# Patient Record
Sex: Male | Born: 1975 | Race: Black or African American | Marital: Married | Smoking: Never smoker
Health system: Southern US, Community
[De-identification: ages and names within clinical notes are randomized; demographics above are authoritative.]

## PROBLEM LIST (undated history)

## (undated) HISTORY — PX: OTHER SURGICAL HISTORY: SHX169

---

## 2012-06-22 ENCOUNTER — Ambulatory Visit (INDEPENDENT_AMBULATORY_CARE_PROVIDER_SITE_OTHER): Payer: Medicaid Other | Admitting: Family Medicine

## 2012-06-22 ENCOUNTER — Encounter: Payer: Self-pay | Admitting: Family Medicine

## 2012-06-22 VITALS — BP 114/76 | HR 51 | Ht 70.0 in | Wt 146.0 lb

## 2012-06-22 DIAGNOSIS — M25519 Pain in unspecified shoulder: Secondary | ICD-10-CM

## 2012-06-24 ENCOUNTER — Encounter: Payer: Self-pay | Admitting: Family Medicine

## 2012-06-24 DIAGNOSIS — M25519 Pain in unspecified shoulder: Secondary | ICD-10-CM | POA: Insufficient documentation

## 2012-06-24 NOTE — Progress Notes (Signed)
  Subjective:    Patient ID: Jay Wright, male    DOB: [birth year], 36 y.o.   MRN: [id number]  HPI  Right shoulder pain for greater than 6 years. At some point he has had one or 2 corticosteroid injections which helped for several months. Pain is been getting worse over the last 2-3 months. He is in the [place] for 2 months. He is worried that we finally gets a job that this might impede his ability to do things because his pain increases with carrying heavy things. Pain is at the anterior part of the shoulder and radiates to the scapula on the right. He recounts several episodes of what sounds like subluxation. He's never had surgery on the shoulder. He is right-hand dominant.  PERTINENT  PMH / PSH:  He is a refugee from [place] living in [place] for several years and then recently immigrated to the [place].  Review of Systems    denies numbness or tingling in the arm or hand. Denies recent weight change. Objective:   Physical Exam  GENERAL: Well-developed thin young man no acute distress SHOULDER: Bilaterally he has very mobile shoulders. He has positive apprehension sign on the right. He has some pain with supraspinatus testing and also pain with liftoff sign. His strength in all planes of the rotator cuff is intact and symmetrical with that of the left side. Rhomboid muscle in the back is tender to palpation and this is the area where he has his scapular pain. The scapula move symmetrically.  INJECTION: Patient was given informed consent, signed copy in the chart. Appropriate time out was taken. Area prepped and draped in usual sterile fashion. One cc of methylprednisolone 40 mg/ml plus  9 cc of 1% lidocaine without epinephrine was injected into the 2 cc went into the subacromial bursa and 8 cc were into the glenohumeral joint using a(n) posterior approach. The patient tolerated the procedure well. There were no complications. Post procedure instructions were given.         Assessment & Plan:  #1. Shoulder pain which I think is related to probable multiple subluxations. 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 I'll start him on shoulder strengthening program that is rotator cuff program for both sides. As he is having some pain now we gave him corticosteroid injection today. I'll also start him on pushups for his rhomboid muscle rehabilitation. He will followup in 4-6 weeks.

## 2012-08-14 ENCOUNTER — Ambulatory Visit (INDEPENDENT_AMBULATORY_CARE_PROVIDER_SITE_OTHER): Payer: Medicaid Other | Admitting: Family Medicine

## 2012-08-14 ENCOUNTER — Encounter: Payer: Self-pay | Admitting: Family Medicine

## 2012-08-14 VITALS — BP 115/79 | Ht 73.62 in | Wt 143.3 lb

## 2012-08-14 DIAGNOSIS — M25519 Pain in unspecified shoulder: Secondary | ICD-10-CM

## 2012-08-14 NOTE — Progress Notes (Signed)
[facility] Attending Note: I have seen and examined this patient. I have discussed this patient with the resident and reviewed the assessment and plan as documented above. I agree with the resident's findings and plan. He continues to have pain with lifting activities which limits his ability to do meaningful work. It's unclear to me whether or not surgical intervention would help him that'll be happy to set that up.

## 2012-08-14 NOTE — Assessment & Plan Note (Signed)
Pain improved following injection. However patient continues to note instability.  Plan for MRI arthrogram with followup with surgery as needed.  Discussed warning signs or symptoms. Please see discharge instructions. Patient expresses understanding.

## 2012-08-14 NOTE — Patient Instructions (Addendum)
Thank you for coming in today. We will call you with the results  You have been scheduled for a MR Arthrogram 08/26/12 please arrive at 2:45pm at [address] [facility]  Their phone number is [phone]

## 2012-08-14 NOTE — Progress Notes (Signed)
Jay Wright is a 36 y.o. male who presents to [facility] today for followup right shoulder pain. Patient has had mild to moderate lateral and posterior shoulder pain for several years. Additionally he notes instability. He has a long history of possible shoulder dislocations or subluxations. He is from [place] and has been in a refugee living in [place] until recently.  He used to play soccer as a young man.  He denies any neck pain weakness or numbness.    Pain improved following injection however continued instability and mild subluxation episodes.   PMH reviewed.  History  Substance Use Topics  . Smoking status: Never Smoker   . Smokeless tobacco: Never Used  . Alcohol Use: Not on file   ROS as above otherwise neg   Exam:  BP 115/79  Ht 6' 1.62" (1.87 m)  Wt 143 lb 4.8 oz (65 kg)  BMI 18.59 kg/m2 Gen: Well NAD MSK: Right shoulder:  Normal in appearance, good muscle bulk no atrophy.  Range of motion intact abduction, forward flexion, internal and external rotation Strength intact 5/5 in all modalities Negative impingement testing, including Hawkins, Neer's, empty can.  Mildly positive anterior apprehension test.

## 2012-08-17 ENCOUNTER — Ambulatory Visit: Payer: Self-pay | Admitting: Family Medicine

## 2012-08-26 ENCOUNTER — Ambulatory Visit
Admission: RE | Admit: 2012-08-26 | Discharge: 2012-08-26 | Disposition: A | Discharge status: Home / Self Care | Payer: Medicaid Other | Source: Ambulatory Visit | Attending: Family Medicine | Admitting: Family Medicine

## 2012-08-26 DIAGNOSIS — M25519 Pain in unspecified shoulder: Secondary | ICD-10-CM

## 2012-08-26 MED ORDER — IOHEXOL 180 MG/ML  SOLN
15.0000 mL | Freq: Once | INTRAMUSCULAR | Status: AC | PRN
  Administered 2012-08-26: 15 mL via INTRA_ARTICULAR

## 2012-08-27 ENCOUNTER — Telehealth: Payer: Self-pay | Admitting: Family Medicine

## 2012-08-27 NOTE — Telephone Encounter (Signed)
Amy plz call him and tell him there IS  A small tear in the lining of his shoulder joint--I would rec he see an orthopedist---I would set him up with Graves or Dahldorf Or Josiah Lobo! Denny Levy

## 2012-08-27 NOTE — Telephone Encounter (Signed)
Left VM for patient to return my call

## 2012-08-31 NOTE — Telephone Encounter (Signed)
Left pt. VM to return my call.

## 2012-08-31 NOTE — Telephone Encounter (Signed)
Scheduled pt with Dr. Luiz Blare for 09/08/12, but pt asked that I cancel the appt and he would call back when he wants it rescheduled.  NPI number for pt's PCP is [id number]- [facility] be needed for Medicaid referral.

## 2012-09-04 ENCOUNTER — Encounter: Payer: Self-pay | Admitting: Family Medicine

## 2012-09-04 ENCOUNTER — Ambulatory Visit (INDEPENDENT_AMBULATORY_CARE_PROVIDER_SITE_OTHER): Payer: Medicaid Other | Admitting: Family Medicine

## 2012-09-04 VITALS — BP 112/79 | HR 66 | Ht 70.08 in | Wt 144.0 lb

## 2012-09-04 DIAGNOSIS — M25519 Pain in unspecified shoulder: Secondary | ICD-10-CM

## 2012-09-04 NOTE — Patient Instructions (Addendum)
You have been scheduled for an appointment at [facility] 09/11/12 at 10:15am with Dr. Luiz Blare.    Their address is [address]                              [place], [address]  Phone number is [phone]

## 2012-09-04 NOTE — Progress Notes (Signed)
Patient ID: Jay Wright, male   DOB: [birth year], 36 y.o.   MRN: [id number] Here for questions relating to his right shoulder pain. We had contacted him after the MR arthrogram. He has a slap lesion in the right shoulder. He is questioning whether not an arthroscopic procedure or other type surgery would allow him to do heavy work. I told him that I don't think he can become a weightlifter but I imagine he can do most work after an arthroscopic or other type of shoulder procedure.  I think he is concerned about making a living. He is got a five-year plan in place in is very diligent in finding work and taking care of his family. The shoulder pain is worse with lifting of objects 25 pounds or more.  Assessment: Slap lesion. He is fairly mobile shoulder joints as well. I have referred him to orthopedics for further evaluation and treatment. He can followup with [place] when necessary.

## 2012-10-29 ENCOUNTER — Ambulatory Visit: Payer: Medicaid Other | Attending: Orthopedic Surgery | Admitting: Physical Therapy

## 2012-10-29 DIAGNOSIS — IMO0001 Reserved for inherently not codable concepts without codable children: Secondary | ICD-10-CM | POA: Insufficient documentation

## 2012-10-29 DIAGNOSIS — M25619 Stiffness of unspecified shoulder, not elsewhere classified: Secondary | ICD-10-CM | POA: Insufficient documentation

## 2012-10-29 DIAGNOSIS — M25519 Pain in unspecified shoulder: Secondary | ICD-10-CM | POA: Insufficient documentation

## 2012-11-09 ENCOUNTER — Ambulatory Visit: Payer: Medicaid Other | Admitting: Rehabilitation

## 2012-11-16 ENCOUNTER — Ambulatory Visit: Payer: Medicaid Other | Admitting: Rehabilitation

## 2012-11-23 ENCOUNTER — Ambulatory Visit: Payer: Medicaid Other | Attending: Orthopedic Surgery | Admitting: Rehabilitation

## 2012-11-23 DIAGNOSIS — M25519 Pain in unspecified shoulder: Secondary | ICD-10-CM | POA: Insufficient documentation

## 2012-11-23 DIAGNOSIS — M25619 Stiffness of unspecified shoulder, not elsewhere classified: Secondary | ICD-10-CM | POA: Insufficient documentation

## 2012-11-23 DIAGNOSIS — IMO0001 Reserved for inherently not codable concepts without codable children: Secondary | ICD-10-CM | POA: Insufficient documentation

## 2012-12-01 ENCOUNTER — Encounter: Payer: Medicaid Other | Admitting: Rehabilitation

## 2012-12-08 ENCOUNTER — Encounter: Payer: Medicaid Other | Admitting: Rehabilitation

## 2012-12-30 ENCOUNTER — Ambulatory Visit: Payer: Medicaid Other | Attending: Orthopedic Surgery | Admitting: Physical Therapy

## 2012-12-30 DIAGNOSIS — M25619 Stiffness of unspecified shoulder, not elsewhere classified: Secondary | ICD-10-CM | POA: Insufficient documentation

## 2012-12-30 DIAGNOSIS — IMO0001 Reserved for inherently not codable concepts without codable children: Secondary | ICD-10-CM | POA: Insufficient documentation

## 2012-12-30 DIAGNOSIS — M25519 Pain in unspecified shoulder: Secondary | ICD-10-CM | POA: Insufficient documentation

## 2013-01-04 ENCOUNTER — Ambulatory Visit: Payer: Medicaid Other | Admitting: Physical Therapy

## 2013-01-05 ENCOUNTER — Ambulatory Visit: Payer: Medicaid Other | Admitting: Physical Therapy

## 2013-01-11 ENCOUNTER — Ambulatory Visit: Payer: Medicaid Other | Admitting: Physical Therapy

## 2013-01-14 ENCOUNTER — Ambulatory Visit: Payer: Medicaid Other | Admitting: Physical Therapy

## 2013-01-19 ENCOUNTER — Ambulatory Visit: Payer: Medicaid Other | Admitting: Rehabilitation

## 2013-01-20 ENCOUNTER — Ambulatory Visit: Payer: Medicaid Other | Admitting: Rehabilitation

## 2013-01-26 ENCOUNTER — Ambulatory Visit: Payer: Medicaid Other | Attending: Orthopedic Surgery | Admitting: Rehabilitation

## 2013-01-26 DIAGNOSIS — M25619 Stiffness of unspecified shoulder, not elsewhere classified: Secondary | ICD-10-CM | POA: Insufficient documentation

## 2013-01-26 DIAGNOSIS — IMO0001 Reserved for inherently not codable concepts without codable children: Secondary | ICD-10-CM | POA: Insufficient documentation

## 2013-01-26 DIAGNOSIS — M25519 Pain in unspecified shoulder: Secondary | ICD-10-CM | POA: Insufficient documentation

## 2013-01-28 ENCOUNTER — Ambulatory Visit: Payer: Medicaid Other | Admitting: Physical Therapy

## 2013-02-02 ENCOUNTER — Ambulatory Visit: Payer: Medicaid Other | Admitting: Rehabilitation

## 2013-02-04 ENCOUNTER — Ambulatory Visit: Payer: Medicaid Other | Admitting: Physical Therapy

## 2013-02-04 ENCOUNTER — Encounter: Payer: Medicaid Other | Admitting: Rehabilitation

## 2013-02-09 ENCOUNTER — Ambulatory Visit: Payer: Medicaid Other | Admitting: Rehabilitation

## 2013-02-11 ENCOUNTER — Ambulatory Visit: Payer: Medicaid Other | Admitting: Rehabilitation

## 2013-02-16 ENCOUNTER — Ambulatory Visit: Payer: Medicaid Other | Admitting: Physical Therapy

## 2013-02-16 ENCOUNTER — Encounter: Payer: No Typology Code available for payment source | Admitting: Physical Therapy

## 2013-02-18 ENCOUNTER — Encounter: Payer: No Typology Code available for payment source | Admitting: Physical Therapy

## 2013-02-23 ENCOUNTER — Encounter: Payer: No Typology Code available for payment source | Admitting: Physical Therapy

## 2013-02-25 ENCOUNTER — Encounter: Payer: No Typology Code available for payment source | Admitting: Physical Therapy

## 2014-08-23 ENCOUNTER — Ambulatory Visit: Payer: Medicaid Other | Attending: Orthopedic Surgery | Admitting: Physical Therapy

## 2014-08-23 DIAGNOSIS — IMO0001 Reserved for inherently not codable concepts without codable children: Secondary | ICD-10-CM | POA: Diagnosis not present

## 2014-08-23 DIAGNOSIS — M25569 Pain in unspecified knee: Secondary | ICD-10-CM | POA: Insufficient documentation

## 2016-11-13 ENCOUNTER — Other Ambulatory Visit: Payer: Self-pay | Admitting: Family Medicine

## 2016-11-13 ENCOUNTER — Ambulatory Visit
Admission: RE | Admit: 2016-11-13 | Discharge: 2016-11-13 | Disposition: A | Discharge status: Home / Self Care | Payer: No Typology Code available for payment source | Source: Ambulatory Visit | Attending: Family Medicine | Admitting: Family Medicine

## 2016-11-13 DIAGNOSIS — M25561 Pain in right knee: Secondary | ICD-10-CM

## 2017-09-29 ENCOUNTER — Other Ambulatory Visit: Payer: Self-pay | Admitting: Internal Medicine

## 2017-09-29 ENCOUNTER — Ambulatory Visit
Admission: RE | Admit: 2017-09-29 | Discharge: 2017-09-29 | Disposition: A | Discharge status: Home / Self Care | Payer: No Typology Code available for payment source | Source: Ambulatory Visit | Attending: Internal Medicine | Admitting: Internal Medicine

## 2017-09-29 DIAGNOSIS — M79642 Pain in left hand: Secondary | ICD-10-CM

## 2018-06-01 ENCOUNTER — Encounter: Payer: Self-pay | Admitting: Nurse Practitioner

## 2018-06-01 ENCOUNTER — Ambulatory Visit: Payer: Self-pay | Attending: Nurse Practitioner | Admitting: Nurse Practitioner

## 2018-06-01 VITALS — BP 106/82 | HR 72 | Temp 98.6°F | Ht 68.0 in | Wt 147.0 lb

## 2018-06-01 DIAGNOSIS — M25512 Pain in left shoulder: Secondary | ICD-10-CM | POA: Insufficient documentation

## 2018-06-01 DIAGNOSIS — Z0000 Encounter for general adult medical examination without abnormal findings: Secondary | ICD-10-CM

## 2018-06-01 DIAGNOSIS — R42 Dizziness and giddiness: Secondary | ICD-10-CM | POA: Insufficient documentation

## 2018-06-01 DIAGNOSIS — M25519 Pain in unspecified shoulder: Secondary | ICD-10-CM

## 2018-06-01 DIAGNOSIS — G8929 Other chronic pain: Secondary | ICD-10-CM | POA: Insufficient documentation

## 2018-06-01 DIAGNOSIS — M791 Myalgia, unspecified site: Secondary | ICD-10-CM | POA: Insufficient documentation

## 2018-06-01 DIAGNOSIS — Z9889 Other specified postprocedural states: Secondary | ICD-10-CM | POA: Insufficient documentation

## 2018-06-01 DIAGNOSIS — M542 Cervicalgia: Secondary | ICD-10-CM | POA: Insufficient documentation

## 2018-06-01 DIAGNOSIS — M25511 Pain in right shoulder: Secondary | ICD-10-CM | POA: Insufficient documentation

## 2018-06-01 DIAGNOSIS — H409 Unspecified glaucoma: Secondary | ICD-10-CM | POA: Insufficient documentation

## 2018-06-01 MED ORDER — FLUTICASONE PROPIONATE 50 MCG/ACT NA SUSP: 2.0000 | Freq: Every day | NASAL | 6 refills | Status: DC

## 2018-06-01 MED ORDER — CYCLOBENZAPRINE HCL 5 MG PO TABS: 5.0000 mg | ORAL_TABLET | Freq: Three times a day (TID) | ORAL | 1 refills | Status: DC | PRN

## 2018-06-01 NOTE — Progress Notes (Signed)
Assessment & Plan:  Jay Wright was seen today for new patient (initial visit).  Diagnoses and all orders for this visit:  Chronic left shoulder pain -     cyclobenzaprine (FLEXERIL) 5 MG tablet; Take 1 tablet (5 mg total) by mouth 3 (three) times daily as needed for muscle spasms.   Dizziness -     fluticasone (FLONASE) 50 MCG/ACT nasal spray; Place 2 sprays into both nostrils daily.  Neck and shoulder pain -     cyclobenzaprine (FLEXERIL) 5 MG tablet; Take 1 tablet (5 mg total) by mouth 3 (three) times daily as needed for muscle spasms.  May alternate with heat and ice application for pain relief. May also alternate with acetaminophen and Ibuprofen as prescribed for back pain. Other alternatives include massage and acupuncture.    Routine adult health maintenance -     CBC -     Basic metabolic panel -     Lipid panel -     VITAMIN D 25 Hydroxy (Vit-D Deficiency, Fractures)    Patient has been counseled on age-appropriate routine health concerns for screening and prevention. These are reviewed and up-to-date. Referrals have been placed accordingly. Immunizations are up-to-date or declined.    Subjective:   Chief Complaint  Patient presents with  . New Patient (Initial Visit)    Pt. is here for dizzness, he stated he gets dizzy when he drive and walk. Pt. stated he have pain on both of his shoulder radiates to his neck.    HPI Jay Wright 42 y.o. male presents to office today to establish care. He is currently seeing an eye doctor for glaucoma in his left eye. Taking prednisolone at this time.   Shoulder Pain Patient complaints of left shoulder pain along with myalgia of his neck and bilateral trapezius area.   He denies any injury to his left shoulder. The pain is described as aching and sharp.  The onset of the pain was several years ago.  The pain occurs intermittently and can sometimes lasts several hours.  Location is global. No history of dislocation. Symptoms are aggravated by  lifting, twisting, repetitive use. Symptoms are diminished by  avoiding the painful activities.  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 He had right shoulder arthroscopic surgery several years ago and reports minimal pain in his right shoulder.    Dizziness Endorses dizziness with onset 3 weeks ago. Dizziness has improved over the past few days. Aggravating factors: Sudden movements. He does state he just finished Ramadan and may have not been drinking enough fluids during this religious time of fasting. Since Ramadan is over he has returned to his normal dietary intake and dizziness has improved.    Review of Systems  Constitutional: Negative for fever, malaise/fatigue and weight loss.  HENT: Negative.  Negative for nosebleeds.   Eyes: Positive for blurred vision and double vision. Negative for photophobia.       Glaucoma  Respiratory: Negative.  Negative for cough and shortness of breath.   Cardiovascular: Negative.  Negative for chest pain, palpitations and leg swelling.  Gastrointestinal: Negative.  Negative for heartburn, nausea and vomiting.  Genitourinary: Negative.  Negative for dysuria, flank pain, frequency, hematuria and urgency.  Musculoskeletal: Positive for joint pain, myalgias and neck pain.  Neurological: Positive for dizziness. Negative for focal weakness, seizures and headaches.  Psychiatric/Behavioral: Negative.  Negative for suicidal ideas.    History reviewed. No pertinent past medical history.  Past Surgical History:  Procedure Laterality Date  . right shoulder surgery      History reviewed. No pertinent family history.  Social History Reviewed with no changes to be made today.   Outpatient Medications Prior to Visit  Medication Sig Dispense Refill  . prednisoLONE acetate (PRED FORTE) 1 % ophthalmic suspension 1 drop 4 (four) times daily.     No facility-administered medications prior to visit.     No Known Allergies     Objective:      BP 106/82 (BP Location: Right Arm, Patient Position: Sitting, Cuff Size: Normal)   Pulse 72   Temp 98.6 F (37 C) (Oral)   Ht 5\' 8"  (1.727 m)   Wt 147 lb (66.7 kg)   SpO2 95%   BMI 22.35 kg/m  Wt Readings from Last 3 Encounters:  06/01/18 147 lb (66.7 kg)  09/04/12 144 lb (65.3 kg)  08/14/12 143 lb 4.8 oz (65 kg)    Physical Exam  Constitutional: He is oriented to person, place, and time. He appears well-developed and well-nourished. He is cooperative.  HENT:  Head: Normocephalic and atraumatic.  Right Ear: Tympanic membrane is scarred. A middle ear effusion is present.  Left Ear: A middle ear effusion is present.  Nose: Nose normal.  Eyes: EOM are normal.  Neck: Normal range of motion.  Cardiovascular: Normal rate, regular rhythm and normal heart sounds. Exam reveals no gallop and no friction rub.  No murmur heard. Pulmonary/Chest: Effort normal and breath sounds normal. No tachypnea. No respiratory distress. He has no decreased breath sounds. He has no wheezes. He has no rhonchi. He has no rales. He exhibits no tenderness.  Abdominal: Soft. Bowel sounds are normal.  Musculoskeletal: Normal range of motion. He exhibits no edema.       Left shoulder: He exhibits pain (with bilateral push pull maneuver). He exhibits normal range of motion, no tenderness, no bony tenderness and no swelling.       Arms: Neurological: He is alert and oriented to person, place, and time. He displays a negative Romberg sign. Coordination and gait normal.  Skin: Skin is warm and dry.  Psychiatric: He has a normal mood and affect. His behavior is normal. Judgment and thought content normal.  Nursing note and vitals reviewed.     Patient has been counseled extensively about nutrition and exercise as well as the importance of adherence with medications and regular follow-up. The patient was given clear instructions to go to ER or return to medical center if symptoms don't improve, worsen or new  problems develop. The patient verbalized understanding.   Follow-up: Return in about 8 weeks (around 07/27/2018).   Claiborne Rigg, FNP-BC [facility] [place], [place] [phone]   06/01/2018, 2:39 PM

## 2018-06-02 ENCOUNTER — Other Ambulatory Visit: Payer: Self-pay | Admitting: Nurse Practitioner

## 2018-06-02 LAB — CBC
HEMOGLOBIN: 14.9 g/dL (ref 13.0–17.7)
Hematocrit: 42.9 % (ref 37.5–51.0)
MCH: 28.2 pg (ref 26.6–33.0)
MCHC: 34.7 g/dL (ref 31.5–35.7)
MCV: 81 fL (ref 79–97)
Platelets: 207 10*3/uL (ref 150–450)
RBC: 5.28 x10E6/uL (ref 4.14–5.80)
RDW: 13.9 % (ref 12.3–15.4)
WBC: 3.2 10*3/uL — ABNORMAL LOW (ref 3.4–10.8)

## 2018-06-02 LAB — BASIC METABOLIC PANEL
BUN / CREAT RATIO: 13 (ref 9–20)
BUN: 13 mg/dL (ref 6–20)
CO2: 26 mmol/L (ref 20–29)
CREATININE: 0.97 mg/dL (ref 0.76–1.27)
Calcium: 9.4 mg/dL (ref 8.7–10.2)
Chloride: 103 mmol/L (ref 96–106)
GFR, EST AFRICAN AMERICAN: 113 mL/min/{1.73_m2} (ref >59)
GFR, EST NON AFRICAN AMERICAN: 98 mL/min/{1.73_m2} (ref >59)
Glucose: 85 mg/dL (ref 65–99)
Potassium: 4.3 mmol/L (ref 3.5–5.2)
Sodium: 141 mmol/L (ref 134–144)

## 2018-06-02 LAB — VITAMIN D 25 HYDROXY (VIT D DEFICIENCY, FRACTURES): VIT D 25 HYDROXY: 13 ng/mL — AB (ref 30.0–100.0)

## 2018-06-02 LAB — LIPID PANEL
Chol/HDL Ratio: 2.6 ratio (ref 0.0–5.0)
Cholesterol, Total: 132 mg/dL (ref 100–199)
HDL: 50 mg/dL (ref >39)
LDL CALC: 67 mg/dL (ref 0–99)
Triglycerides: 74 mg/dL (ref 0–149)
VLDL CHOLESTEROL CAL: 15 mg/dL (ref 5–40)

## 2018-06-02 MED ORDER — VITAMIN D (ERGOCALCIFEROL) 1.25 MG (50000 UNIT) PO CAPS: 50000.0000 [IU] | ORAL_CAPSULE | ORAL | 1 refills | Status: DC

## 2018-06-04 ENCOUNTER — Telehealth: Payer: Self-pay

## 2018-06-04 NOTE — Telephone Encounter (Signed)
CMA attempt to call patient to inform on lab results. No answer and left a VM for patient to call back.  If patient call back, please inform:  Labs are essentially normal however vitamin d is low. Will send in a prescription for you to take vitamin d weekly for the next 12 weeks. Make sure you are drinking at least 48 oz of water per day. Work on eating a low fat, heart healthy diet and participate in regular aerobic exercise program to control as well. Exercise at least 150 minutes per week.

## 2018-06-04 NOTE — Telephone Encounter (Signed)
-----   Message from Claiborne RiggZelda W Fleming, NP sent at 06/02/2018  5:03 PM EDT ----- Labs are essentially normal however vitamin d is low. Will send in a prescription for you to take vitamin d weekly for the next 12 weeks. Make sure you are drinking at least 48 oz of water per day. Work on eating a low fat, heart healthy diet and participate in regular aerobic exercise program to control as well. Exercise at least 150 minutes per week.

## 2018-06-08 ENCOUNTER — Ambulatory Visit: Payer: Medicaid Other | Attending: Nurse Practitioner

## 2018-06-11 ENCOUNTER — Telehealth: Payer: Self-pay | Admitting: Nurse Practitioner

## 2018-06-11 NOTE — Telephone Encounter (Signed)
Pt called requesting his OC, if pt called please inform him it may take up to 2 weekd for it to be ready

## 2018-06-19 ENCOUNTER — Encounter (HOSPITAL_COMMUNITY): Payer: Self-pay | Admitting: *Deleted

## 2018-06-19 ENCOUNTER — Other Ambulatory Visit: Payer: Self-pay

## 2018-06-19 ENCOUNTER — Ambulatory Visit (HOSPITAL_COMMUNITY)
Admission: EM | Admit: 2018-06-19 | Discharge: 2018-06-19 | Disposition: A | Discharge status: Home / Self Care | Payer: Medicaid Other | Attending: Family Medicine | Admitting: Family Medicine

## 2018-06-19 DIAGNOSIS — X500XXA Overexertion from strenuous movement or load, initial encounter: Secondary | ICD-10-CM

## 2018-06-19 DIAGNOSIS — S39012A Strain of muscle, fascia and tendon of lower back, initial encounter: Secondary | ICD-10-CM

## 2018-06-19 MED ORDER — DICLOFENAC SODIUM 75 MG PO TBEC: 75.0000 mg | DELAYED_RELEASE_TABLET | Freq: Two times a day (BID) | ORAL | 0 refills | Status: DC

## 2018-06-19 MED ORDER — KETOROLAC TROMETHAMINE 60 MG/2ML IM SOLN
60.0000 mg | Freq: Once | INTRAMUSCULAR | Status: AC
  Administered 2018-06-19: 60 mg via INTRAMUSCULAR

## 2018-06-19 MED ORDER — KETOROLAC TROMETHAMINE 60 MG/2ML IM SOLN
INTRAMUSCULAR | Status: AC
  Filled 2018-06-19: qty 2

## 2018-06-19 MED ORDER — PREDNISONE 50 MG PO TABS: 50.0000 mg | ORAL_TABLET | Freq: Every day | ORAL | 0 refills | Status: AC

## 2018-06-19 MED ORDER — METHOCARBAMOL 750 MG PO TABS: 750.0000 mg | ORAL_TABLET | Freq: Four times a day (QID) | ORAL | 0 refills | Status: AC

## 2018-06-19 NOTE — ED Triage Notes (Signed)
C/o lower back pain onset last Sunday

## 2018-06-19 NOTE — ED Provider Notes (Signed)
[facility]    CSN: [id number] Arrival date & time: 06/19/18  [id number]     History   Chief Complaint Chief Complaint  Patient presents with  . Back Pain    HPI Jay Wright is a 42 y.o. male no significant past medical history presenting today for evaluation of back pain.  Patient states that last Sunday, approximately 6 days ago he was carrying a heavy load of close, he all of a sudden felt a pull in his left lower back.  Since he has had significant pain and worsening pain with bending and twisting motions.  Denies any radiation down his leg.  Denies numbness or tingling.  Denies saddle anesthesia or loss of bowel or bladder control.  He has taken Flexeril which he uses for his shoulder, but this is not been helping him.  HPI  History reviewed. No pertinent past medical history.  Patient Active Problem List   Diagnosis Date Noted  . Shoulder pain 06/24/2012    Past Surgical History:  Procedure Laterality Date  . right shoulder surgery         Home Medications    Prior to Admission medications   Medication Sig Start Date End Date Taking? Authorizing Provider  cyclobenzaprine (FLEXERIL) 5 MG tablet Take 1 tablet (5 mg total) by mouth 3 (three) times daily as needed for muscle spasms. 06/01/18  Yes Claiborne RiggFleming, Zelda W, NP  fluticasone (FLONASE) 50 MCG/ACT nasal spray Place 2 sprays into both nostrils daily. 06/01/18  Yes Claiborne RiggFleming, Zelda W, NP  diclofenac (VOLTAREN) 75 MG EC tablet Take 1 tablet (75 mg total) by mouth 2 (two) times daily. 06/19/18   Madden Garron C, PA-C  methocarbamol (ROBAXIN-750) 750 MG tablet Take 1 tablet (750 mg total) by mouth 4 (four) times daily for 10 days. 06/19/18 06/29/18  Shakiyla Kook C, PA-C  predniSONE (DELTASONE) 50 MG tablet Take 1 tablet (50 mg total) by mouth daily for 5 days. 06/19/18 06/24/18  Falon Flinchum, Junius CreamerHallie C, PA-C    Family History Family History  Problem Relation Age of Onset  . Healthy Mother   . Healthy Father     Social  History Social History   Tobacco Use  . Smoking status: Never Smoker  . Smokeless tobacco: Never Used  Substance Use Topics  . Alcohol use: Never    Frequency: Never  . Drug use: Never     Allergies   Patient has no known allergies.   Review of Systems Review of Systems  Constitutional: Negative for activity change, chills, diaphoresis and fatigue.  Eyes: Negative for photophobia and visual disturbance.  Respiratory: Negative for cough, chest tightness and shortness of breath.   Cardiovascular: Negative for chest pain and leg swelling.  Gastrointestinal: Negative for abdominal pain, blood in stool, nausea and vomiting.  Musculoskeletal: Positive for back pain and myalgias. Negative for arthralgias, gait problem, neck pain and neck stiffness.  Skin: Negative for color change and wound.  Neurological: Negative for dizziness, weakness, light-headedness, numbness and headaches.     Physical Exam Triage Vital Signs ED Triage Vitals  Enc Vitals Group     BP 06/19/18 1020 121/71     Pulse Rate 06/19/18 1020 86     Resp 06/19/18 1020 18     Temp 06/19/18 1020 98.3 F (36.8 C)     Temp Source 06/19/18 1020 Oral     SpO2 06/19/18 1020 98 %     Weight --      Height --  Head Circumference --      Peak Flow --      Pain Score 06/19/18 1021 10     Pain Loc --      Pain Edu? --      Excl. in GC? --    No data found.  Updated Vital Signs BP 121/71 (BP Location: Right Arm)   Pulse 86   Temp 98.3 F (36.8 C) (Oral)   Resp 18   SpO2 98%   Visual Acuity Right Eye Distance:   Left Eye Distance:   Bilateral Distance:    Right Eye Near:   Left Eye Near:    Bilateral Near:     Physical Exam  Constitutional: He appears well-developed and well-nourished.  HENT:  Head: Normocephalic and atraumatic.  Eyes: Conjunctivae are normal.  Neck: Neck supple.  Cardiovascular: Normal rate and regular rhythm.  No murmur heard. Pulmonary/Chest: Effort normal and breath  sounds normal. No respiratory distress.  Abdominal: Soft. There is no tenderness.  Musculoskeletal: He exhibits no edema.  Nontender to palpation of entire cervical, thoracic and lumbar spine, tenderness to palpation over left lumbar paraspinal musculature as well as more lateral left musculature.  Negative straight leg raise, although does worsen pain in left lumbar region. Able to ambulate from chair to exam table without abnormality, patient is slow to lie down and sit back up from supine position.  Neurological: He is alert.  Skin: Skin is warm and dry.  Psychiatric: He has a normal mood and affect.  Nursing note and vitals reviewed.    UC Treatments / Results  Labs (all labs ordered are listed, but only abnormal results are displayed) Labs Reviewed - No data to display  EKG None  Radiology No results found.  Procedures Procedures (including critical care time)  Medications Ordered in UC Medications  ketorolac (TORADOL) injection 60 mg (60 mg Intramuscular Given 06/19/18 1043)    Initial Impression / Assessment and Plan / UC Course  I have reviewed the triage vital signs and the nursing notes.  Pertinent labs & imaging results that were available during my care of the patient were reviewed by me and considered in my medical decision making (see chart for details).     Patient likely with lumbar strain, will recommend anti-inflammatories as well as muscle relaxer.  Will put on short course of prednisone for 5 days.  Shot of Toradol provided in clinic today.  No red flags or signs of cauda equina.  Also discussed no heavy lifting, performing back strengthening exercises once pain improving, discussed proper lifting mechanics with legs versus back.Discussed strict return precautions. Patient verbalized understanding and is agreeable with plan.  Final Clinical Impressions(s) / UC Diagnoses   Final diagnoses:  Strain of lumbar region, initial encounter     Discharge  Instructions     Please take diclofenac twice daily, please take with food Please take prednisone daily for the next 5 days, please take with food Please use Robaxin, this is a muscle relaxer, please do not use in combination with Flexeril, please use either Robaxin or Flexeril.   Please also alternate applying ice and heat throughout the day.  As will help with inflammation, he will help loosen up the muscle.  Please do not stay in bed all day, please move around and go about normal activities, but do not do any heavy lifting or excessive exercise that will worsen this.  Please return in 1 to 2 weeks if symptoms not improving, symptoms worsening,  developing loss of bowel or bladder control, numbness or tingling.   ED Prescriptions    Medication Sig Dispense Auth. Provider   diclofenac (VOLTAREN) 75 MG EC tablet Take 1 tablet (75 mg total) by mouth 2 (two) times daily. 30 tablet Rosmary Dionisio C, PA-C   predniSONE (DELTASONE) 50 MG tablet Take 1 tablet (50 mg total) by mouth daily for 5 days. 5 tablet Valena Ivanov C, PA-C   methocarbamol (ROBAXIN-750) 750 MG tablet Take 1 tablet (750 mg total) by mouth 4 (four) times daily for 10 days. 40 tablet Eloni Darius, Hachita C, PA-C     Controlled Substance Prescriptions [place] Controlled Substance Registry consulted? Not Applicable   Lew Dawes, [place] 06/19/18 1100

## 2018-06-19 NOTE — Discharge Instructions (Signed)
Please take diclofenac twice daily, please take with food Please take prednisone daily for the next 5 days, please take with food Please use Robaxin, this is a muscle relaxer, please do not use in combination with Flexeril, please use either Robaxin or Flexeril.   Please also alternate applying ice and heat throughout the day.  As will help with inflammation, he will help loosen up the muscle.  Please do not stay in bed all day, please move around and go about normal activities, but do not do any heavy lifting or excessive exercise that will worsen this.  Please return in 1 to 2 weeks if symptoms not improving, symptoms worsening, developing loss of bowel or bladder control, numbness or tingling.

## 2018-06-26 ENCOUNTER — Ambulatory Visit: Payer: Medicaid Other | Admitting: Nurse Practitioner

## 2018-07-31 ENCOUNTER — Ambulatory Visit: Payer: Self-pay | Attending: Nurse Practitioner | Admitting: Nurse Practitioner

## 2018-07-31 ENCOUNTER — Encounter: Payer: Self-pay | Admitting: Nurse Practitioner

## 2018-07-31 VITALS — BP 109/69 | HR 70 | Temp 98.3°F | Ht 68.0 in | Wt 152.8 lb

## 2018-07-31 DIAGNOSIS — R42 Dizziness and giddiness: Secondary | ICD-10-CM | POA: Insufficient documentation

## 2018-07-31 DIAGNOSIS — S6991XD Unspecified injury of right wrist, hand and finger(s), subsequent encounter: Secondary | ICD-10-CM | POA: Insufficient documentation

## 2018-07-31 DIAGNOSIS — Z79899 Other long term (current) drug therapy: Secondary | ICD-10-CM | POA: Insufficient documentation

## 2018-07-31 DIAGNOSIS — S6991XA Unspecified injury of right wrist, hand and finger(s), initial encounter: Secondary | ICD-10-CM

## 2018-07-31 DIAGNOSIS — M25519 Pain in unspecified shoulder: Secondary | ICD-10-CM | POA: Insufficient documentation

## 2018-07-31 DIAGNOSIS — W228XXD Striking against or struck by other objects, subsequent encounter: Secondary | ICD-10-CM | POA: Insufficient documentation

## 2018-07-31 NOTE — Progress Notes (Signed)
Assessment & Plan:  Jay Wright was seen today for follow-up.  Diagnoses and all orders for this visit:  Hand injury, right, subsequent encounter -     DG Hand Complete Right; Future If no improvement of symptoms will refer to hand surgery.  Patient has been counseled on age-appropriate routine health concerns for screening and prevention. These are reviewed and up-to-date. Referrals have been placed accordingly. Immunizations are up-to-date or declined.    Subjective:   Chief Complaint  Patient presents with  . Follow-up    Pt. is here for follow-up on shoulder pain and dizziness. Pt. stated he don't feel dizzy anymore and his shoulder pain is much better. Pt. had a incident at work with boxes fell on his right hand and he cannot feel any pinching on his right pinky.    HPI Jay Wright 42 y.o. male presents to office today with complaints of Right hand pain and numbness in his fingers. 3 days ago he was at work and notes he was pulling a box off of a shelf which had another box on top of it. When he attempted to pull the bottom box out the top box fell onto his right hand. He had immediate pain however the numbness and tingling onset did not occur until 24 hours after the injury. He has tried nothing for the pain or numbness.   Review of Systems  Constitutional: Negative for fever, malaise/fatigue and weight loss.  HENT: Negative for nosebleeds.   Eyes: Negative.  Negative for blurred vision, double vision and photophobia.  Respiratory: Negative.  Negative for cough and shortness of breath.   Cardiovascular: Negative.  Negative for chest pain, palpitations and leg swelling.  Gastrointestinal: Negative.  Negative for heartburn, nausea and vomiting.  Musculoskeletal: Negative for myalgias.       SEE HPI  Neurological: Negative.  Negative for dizziness, focal weakness, seizures and headaches.  Psychiatric/Behavioral: Negative.  Negative for suicidal ideas.    History reviewed. No pertinent  past medical history.  Past Surgical History:  Procedure Laterality Date  . right shoulder surgery      Family History  Problem Relation Age of Onset  . Healthy Mother   . Healthy Father     Social History Reviewed with no changes to be made today.   Outpatient Medications Prior to Visit  Medication Sig Dispense Refill  . cyclobenzaprine (FLEXERIL) 5 MG tablet Take 1 tablet (5 mg total) by mouth 3 (three) times daily as needed for muscle spasms. 30 tablet 1  . diclofenac (VOLTAREN) 75 MG EC tablet Take 1 tablet (75 mg total) by mouth 2 (two) times daily. 30 tablet 0  . fluticasone (FLONASE) 50 MCG/ACT nasal spray Place 2 sprays into both nostrils daily. 16 g 6   No facility-administered medications prior to visit.     No Known Allergies     Objective:    BP 109/69 (BP Location: Left Arm, Patient Position: Sitting, Cuff Size: Normal)   Pulse 70   Temp 98.3 F (36.8 C) (Oral)   Ht 5\' 8"  (1.727 m)   Wt 152 lb 12.8 oz (69.3 kg)   SpO2 95%   BMI 23.23 kg/m  Wt Readings from Last 3 Encounters:  07/31/18 152 lb 12.8 oz (69.3 kg)  06/01/18 147 lb (66.7 kg)  09/04/12 144 lb (65.3 kg)    Physical Exam  Constitutional: He is oriented to person, place, and time. He appears well-developed and well-nourished. He is cooperative.  HENT:  Head: Normocephalic  and atraumatic.  Cardiovascular: Normal rate, regular rhythm and normal heart sounds. Exam reveals no gallop and no friction rub.  No murmur heard. Pulmonary/Chest: Effort normal and breath sounds normal. No tachypnea. No respiratory distress. He has no decreased breath sounds. He has no wheezes. He has no rhonchi. He has no rales. He exhibits no tenderness.  Abdominal: Bowel sounds are normal.  Musculoskeletal: Normal range of motion. He exhibits tenderness. He exhibits no edema or deformity.       Right hand: He exhibits tenderness and bony tenderness. He exhibits normal range of motion, normal capillary refill, no  deformity, no laceration and no swelling. Normal strength noted. He exhibits no thumb/finger opposition.       Hands: He is able to make a fist with his right hand without eliciting pain. There are no obvious bony abnormalities. Grip Strength 5/5  Neurological: He is alert and oriented to person, place, and time. Coordination normal.  Skin: Skin is warm and dry.  Psychiatric: He has a normal mood and affect. His behavior is normal. Judgment and thought content normal.  Nursing note and vitals reviewed.        Patient has been counseled extensively about nutrition and exercise as well as the importance of adherence with medications and regular follow-up. The patient was given clear instructions to go to ER or return to medical center if symptoms don't improve, worsen or new problems develop. The patient verbalized understanding.   Follow-up: Return in about 2 weeks (around 08/14/2018) for right handl; DOUBLE BOOK MORNING.   Claiborne RiggZelda W Tamberlyn Midgley, FNP-BC [facility] [place], [place] [phone]   07/31/2018, 10:46 AM

## 2018-07-31 NOTE — Patient Instructions (Signed)
Hand Pain  Many things can cause hand pain. Some common causes are:  ? An injury.  ? Repeating the same movement with your hand over and over (overuse).  ? Osteoporosis.  ? Arthritis.  ? Lumps in the tendons or joints of the hand and wrist (ganglion cysts).  ? Infection.  Follow these instructions at home:  Pay attention to any changes in your symptoms. Take these actions to help with your discomfort:  ? If directed, put ice on the affected area:  ? Put ice in a plastic bag.  ? Place a towel between your skin and the bag.  ? Leave the ice on for 15?20 minutes, 3?4 times a day for 2 days.  ? Take over-the-counter and prescription medicines only as told by your health care provider.  ? Minimize stress on your hands and wrists as much as possible.  ? Take breaks from repetitive activity often.  ? Do stretches as told by your health care provider.  ? Do not do activities that make your pain worse.  Contact a health care provider if:  ? Your pain does not get better after a few days of self-care.  ? Your pain gets worse.  ? Your pain affects your ability to do your daily activities.  Get help right away if:  ? Your hand becomes warm, red, or swollen.  ? Your hand is numb or tingling.  ? Your hand is extremely swollen or deformed.  ? Your hand or fingers turn white or blue.  ? You cannot move your hand, wrist, or fingers.  This information is not intended to replace advice given to you by your health care provider. Make sure you discuss any questions you have with your health care provider.  Document Released: 01/05/2016 Document Revised: 05/16/2016 Document Reviewed: 01/04/2015  Elsevier Interactive Patient Education ? 2018 Elsevier Inc.

## 2018-08-03 ENCOUNTER — Ambulatory Visit (HOSPITAL_COMMUNITY)
Admission: RE | Admit: 2018-08-03 | Discharge: 2018-08-03 | Disposition: A | Discharge status: Home / Self Care | Payer: Self-pay | Source: Ambulatory Visit | Attending: Nurse Practitioner | Admitting: Nurse Practitioner

## 2018-08-03 DIAGNOSIS — R200 Anesthesia of skin: Secondary | ICD-10-CM | POA: Insufficient documentation

## 2018-08-03 DIAGNOSIS — S6991XD Unspecified injury of right wrist, hand and finger(s), subsequent encounter: Secondary | ICD-10-CM | POA: Insufficient documentation

## 2018-08-03 DIAGNOSIS — X58XXXD Exposure to other specified factors, subsequent encounter: Secondary | ICD-10-CM | POA: Insufficient documentation

## 2018-08-06 ENCOUNTER — Telehealth: Payer: Self-pay

## 2018-08-06 NOTE — Telephone Encounter (Signed)
CMA attempt to call patient to inform on Xray results.  No answer and left a VM for patient.  If patient call back, please inform:  Hand xray is normal

## 2018-08-06 NOTE — Telephone Encounter (Signed)
-----   Message from Claiborne RiggZelda W Fleming, NP sent at 08/04/2018 11:57 PM EDT ----- Hand xray is normal

## 2018-08-12 ENCOUNTER — Encounter: Payer: Self-pay | Admitting: Nurse Practitioner

## 2018-08-12 ENCOUNTER — Ambulatory Visit: Payer: Self-pay | Attending: Nurse Practitioner | Admitting: Nurse Practitioner

## 2018-08-12 VITALS — BP 119/70 | HR 60 | Temp 98.3°F | Ht 68.0 in | Wt 151.2 lb

## 2018-08-12 DIAGNOSIS — X58XXXA Exposure to other specified factors, initial encounter: Secondary | ICD-10-CM | POA: Insufficient documentation

## 2018-08-12 DIAGNOSIS — Z79899 Other long term (current) drug therapy: Secondary | ICD-10-CM | POA: Insufficient documentation

## 2018-08-12 DIAGNOSIS — R200 Anesthesia of skin: Secondary | ICD-10-CM | POA: Insufficient documentation

## 2018-08-12 DIAGNOSIS — R202 Paresthesia of skin: Secondary | ICD-10-CM | POA: Insufficient documentation

## 2018-08-12 DIAGNOSIS — S6991XA Unspecified injury of right wrist, hand and finger(s), initial encounter: Secondary | ICD-10-CM | POA: Insufficient documentation

## 2018-08-12 NOTE — Patient Instructions (Signed)
Paresthesia Paresthesia is a burning or prickling feeling. This feeling can happen in any part of the body. It often happens in the hands, arms, legs, or feet. Usually, it is not painful. In most cases, the feeling goes away in a short time and is not a sign of a serious problem. Follow these instructions at home:  Avoid drinking alcohol.  Try massage or needle therapy (acupuncture) to help with your problems.  Keep all follow-up visits as told by your doctor. This is important. Contact a doctor if:  You keep on having episodes of paresthesia.  Your burning or prickling feeling gets worse when you walk.  You have pain or cramps.  You feel dizzy.  You have a rash. Get help right away if:  You feel weak.  You have trouble walking or moving.  You have problems speaking, understanding, or seeing.  You feel confused.  You cannot control when you pee (urinate) or poop (bowel movement).  You lose feeling (numbness) after an injury.  You pass out (faint). This information is not intended to replace advice given to you by your health care provider. Make sure you discuss any questions you have with your health care provider. Document Released: 11/21/2008 Document Revised: 05/16/2016 Document Reviewed: 12/05/2014 Elsevier Interactive Patient Education  2018 Elsevier Inc.  

## 2018-08-12 NOTE — Progress Notes (Signed)
Assessment & Plan:  Jay Wright was seen today for follow-up.  Diagnoses and all orders for this visit:  Paresthesia -     Ambulatory referral to Hand Surgery    Patient has been counseled on age-appropriate routine health concerns for screening and prevention. These are reviewed and up-to-date. Referrals have been placed accordingly. Immunizations are up-to-date or declined.    Subjective:   Chief Complaint  Patient presents with  . Follow-up    Pt. is here to follow-up on right hand pain. Pt. stated it hurts a little bit, but more in the morning.    HPI Jay Wright 42 y.o. male presents to office today for follow up to right hand pain and paresthesia.   Right Hand injury Approximately 2-3 weeks ago he sustained a work injury to his right hand. States he was pulling a large box off of a shelf. Apparently the box was sitting under another box and when he went to pull the lower box off the shelf the box that was sitting on top fell onto his right hand. He experienced immediate pain and then the next day began to experience numbness, pain and tingling of the right 4th and 5th fingers. Today he endorses persistent paresthesia of the same area. Xray of the right hand was negative for fracture. As he continues to endorse paresthesia I will refer him to the hand specialist for nerve conduction study.   Review of Systems  Constitutional: Negative for fever, malaise/fatigue and weight loss.  HENT: Negative.  Negative for nosebleeds.   Eyes: Negative.  Negative for blurred vision, double vision and photophobia.  Respiratory: Negative.  Negative for cough and shortness of breath.   Cardiovascular: Negative.  Negative for chest pain, palpitations and leg swelling.  Gastrointestinal: Negative.  Negative for heartburn, nausea and vomiting.  Musculoskeletal: Positive for joint pain (right shoulder). Negative for myalgias.       SEE HPI  Neurological: Positive for tingling and sensory change. Negative  for dizziness, focal weakness, seizures and headaches.  Psychiatric/Behavioral: Negative.  Negative for suicidal ideas.    History reviewed. No pertinent past medical history.  Past Surgical History:  Procedure Laterality Date  . right shoulder surgery      Family History  Problem Relation Age of Onset  . Healthy Mother   . Healthy Father     Social History Reviewed with no changes to be made today.   Outpatient Medications Prior to Visit  Medication Sig Dispense Refill  . fluticasone (FLONASE) 50 MCG/ACT nasal spray Place 2 sprays into both nostrils daily. 16 g 6  . cyclobenzaprine (FLEXERIL) 5 MG tablet Take 1 tablet (5 mg total) by mouth 3 (three) times daily as needed for muscle spasms. (Patient not taking: Reported on 08/12/2018) 30 tablet 1  . diclofenac (VOLTAREN) 75 MG EC tablet Take 1 tablet (75 mg total) by mouth 2 (two) times daily. (Patient not taking: Reported on 08/12/2018) 30 tablet 0   No facility-administered medications prior to visit.     No Known Allergies     Objective:    BP 119/70 (BP Location: Left Arm, Patient Position: Sitting, Cuff Size: Normal)   Pulse 60   Temp 98.3 F (36.8 C) (Oral)   Ht 5\' 8"  (1.727 m)   Wt 151 lb 3.2 oz (68.6 kg)   SpO2 98%   BMI 22.99 kg/m  Wt Readings from Last 3 Encounters:  08/12/18 151 lb 3.2 oz (68.6 kg)  07/31/18 152 lb 12.8 oz (69.3  kg)  06/01/18 147 lb (66.7 kg)    Physical Exam  Constitutional: He is oriented to person, place, and time. He appears well-developed and well-nourished. He is cooperative.  HENT:  Head: Normocephalic and atraumatic.  Eyes: EOM are normal.  Neck: Normal range of motion.  Cardiovascular: Normal rate, regular rhythm and normal heart sounds. Exam reveals no gallop and no friction rub.  No murmur heard. Pulmonary/Chest: Effort normal and breath sounds normal. No tachypnea. No respiratory distress. He has no decreased breath sounds. He has no wheezes. He has no rhonchi. He has no  rales. He exhibits no tenderness.  Abdominal: Soft. Bowel sounds are normal.  Musculoskeletal: Normal range of motion. He exhibits no edema or deformity.       Right hand: He exhibits tenderness. He exhibits normal capillary refill, no deformity, no laceration and no swelling. Decreased sensation noted. Normal strength noted. He exhibits no thumb/finger opposition.       Hands: He endorses numbness and tingling of the right hand. Strength is normal.   Neurological: He is alert and oriented to person, place, and time. Coordination normal.  Skin: Skin is warm and dry.  Psychiatric: He has a normal mood and affect. His behavior is normal. Judgment and thought content normal.  Nursing note and vitals reviewed.        Patient has been counseled extensively about nutrition and exercise as well as the importance of adherence with medications and regular follow-up. The patient was given clear instructions to go to ER or return to medical center if symptoms don't improve, worsen or new problems develop. The patient verbalized understanding.   Follow-up: No follow-ups on file.   Claiborne RiggZelda W Fleming, FNP-BC [facility] [place], [place] [phone]   08/12/2018, 9:54 AM

## 2018-08-21 ENCOUNTER — Ambulatory Visit (INDEPENDENT_AMBULATORY_CARE_PROVIDER_SITE_OTHER): Payer: Self-pay

## 2018-08-21 ENCOUNTER — Ambulatory Visit (INDEPENDENT_AMBULATORY_CARE_PROVIDER_SITE_OTHER): Payer: Self-pay | Admitting: Orthopaedic Surgery

## 2018-08-21 ENCOUNTER — Encounter (INDEPENDENT_AMBULATORY_CARE_PROVIDER_SITE_OTHER): Payer: Self-pay | Admitting: Orthopaedic Surgery

## 2018-08-21 DIAGNOSIS — M79641 Pain in right hand: Secondary | ICD-10-CM | POA: Insufficient documentation

## 2018-08-21 MED ORDER — DICLOFENAC SODIUM 1 % TD GEL: 2.0000 g | Freq: Four times a day (QID) | TRANSDERMAL | 1 refills | Status: DC

## 2018-08-21 NOTE — Progress Notes (Signed)
   Office Visit Note   Patient: Jay Wright           Date of Birth: Dec 26, 1975           MRN: [id number] Visit Date: 08/21/2018              Requested by: Claiborne RiggFleming, Zelda W, NP [address] [place], [place] [id number] PCP: Claiborne RiggFleming, Zelda W, NP   Assessment & Plan: Visit Diagnoses:  1. Right hand pain     Plan: Impression is right hand soft tissue contusion.  I discussed with the patient that this should continue to improve with time.  I do not want to put him in a brace as I do not want him to get stiff.  I will call in diclofenac gel to use as needed.  He will follow-up with us as needed.  Call with concerns or questions.  Follow-Up Instructions: Return if symptoms worsen or fail to improve.   Orders:  Orders Placed This Encounter  Procedures  . XR Hand Complete Right   No orders of the defined types were placed in this encounter.     Procedures: No procedures performed   Clinical Data: No additional findings.   Subjective: Chief Complaint  Patient presents with  . Right Hand - Pain    HPI patient is a pleasant 42 year old gentleman who is here today with right hand pain.  He is a Psychiatric nursefactory worker and sustained an injury approximately 1 month ago when a box fell on the top of his right hand.  Since then he has had pain and mild swelling as well as decreased sensation to the fourth and fifth metacarpal joints radiating down into the ring and small fingers.  This does seem to be slightly improving.  He was seen approximately 10 days ago where x-rays were obtained.  These were negative for fracture.  He has not tried any over-the-counter medications.  He has not tried any bracing.  Review of Systems as detailed in HPI.  All others reviewed and are negative.   Objective: Vital Signs: There were no vitals taken for this visit.  Physical Exam well-developed well-nourished gentleman no acute distress.  Alert and oriented x3.  Ortho Exam examination of his right hand  reveals mild tenderness to the fourth and fifth metacarpals extending into the ring and small fingers.  Full range of motion to the MCP, PIP and DIP joints.  Full strength.  Decreased sensation dorsum of the ulnar aspect of the hand as well as the ring and small fingers.  Specialty Comments:  No specialty comments available.  Imaging: No new imaging   PMFS History: Patient Active Problem List   Diagnosis Date Noted  . Right hand pain 08/21/2018  . Shoulder pain 06/24/2012   History reviewed. No pertinent past medical history.  Family History  Problem Relation Age of Onset  . Healthy Mother   . Healthy Father     Past Surgical History:  Procedure Laterality Date  . right shoulder surgery     Social History   Occupational History  . Not on file  Tobacco Use  . Smoking status: Never Smoker  . Smokeless tobacco: Never Used  Substance and Sexual Activity  . Alcohol use: Never    Frequency: Never  . Drug use: Never  . Sexual activity: Yes

## 2018-08-31 ENCOUNTER — Emergency Department (HOSPITAL_COMMUNITY)
Admission: EM | Admit: 2018-08-31 | Discharge: 2018-08-31 | Disposition: A | Discharge status: Home / Self Care | Payer: Medicaid Other | Attending: Emergency Medicine | Admitting: Emergency Medicine

## 2018-08-31 ENCOUNTER — Encounter (HOSPITAL_COMMUNITY): Payer: Self-pay | Admitting: Emergency Medicine

## 2018-08-31 DIAGNOSIS — S39012A Strain of muscle, fascia and tendon of lower back, initial encounter: Secondary | ICD-10-CM | POA: Insufficient documentation

## 2018-08-31 DIAGNOSIS — Y998 Other external cause status: Secondary | ICD-10-CM | POA: Insufficient documentation

## 2018-08-31 DIAGNOSIS — Y9389 Activity, other specified: Secondary | ICD-10-CM | POA: Insufficient documentation

## 2018-08-31 DIAGNOSIS — Y33XXXA Other specified events, undetermined intent, initial encounter: Secondary | ICD-10-CM | POA: Insufficient documentation

## 2018-08-31 DIAGNOSIS — M5432 Sciatica, left side: Secondary | ICD-10-CM | POA: Insufficient documentation

## 2018-08-31 DIAGNOSIS — Y929 Unspecified place or not applicable: Secondary | ICD-10-CM | POA: Insufficient documentation

## 2018-08-31 DIAGNOSIS — Z79899 Other long term (current) drug therapy: Secondary | ICD-10-CM | POA: Insufficient documentation

## 2018-08-31 MED ORDER — KETOROLAC TROMETHAMINE 15 MG/ML IJ SOLN
15.0000 mg | Freq: Once | INTRAMUSCULAR | Status: AC
Start: 2018-08-31 — End: 2018-08-31
  Administered 2018-08-31: 15 mg via INTRAMUSCULAR
  Filled 2018-08-31: qty 1

## 2018-08-31 MED ORDER — ORPHENADRINE CITRATE ER 100 MG PO TB12: 100.0000 mg | ORAL_TABLET | Freq: Two times a day (BID) | ORAL | 0 refills | Status: AC

## 2018-08-31 MED ORDER — MELOXICAM 7.5 MG PO TABS: 7.5000 mg | ORAL_TABLET | Freq: Every day | ORAL | 0 refills | Status: AC

## 2018-08-31 NOTE — ED Triage Notes (Signed)
Pt reports back pain that radiates down left leg after carrying something heavy. Pt ambulatory to room without distress. Reports hx of the same a few months ago that he was seen at [facility] for. Denies any bowel or bladder issues

## 2018-08-31 NOTE — Discharge Instructions (Addendum)
Take Norflex and meloxicam as needed as prescribed for pain. Warm compresses to your lower back for 20 minutes at a time.  Stretches as demonstrated. Return to ER for worsening or concerning symptoms.  Follow-up with your primary care provider for recheck and to discuss physical therapy.

## 2018-08-31 NOTE — ED Provider Notes (Signed)
[facility] EMERGENCY DEPARTMENT Provider Note   CSN: [id number] Arrival date & time: 08/31/18  4401     History   Chief Complaint Chief Complaint  Patient presents with  . Back Pain    HPI Jay Wright is a 42 y.o. male.  42 year old male presents with complaint of left lower back pain.  Patient states pain started 2 days ago when he was lifting a heavy box.  Patient had a sudden onset of left lower back pain, radiates occasionally down his left lateral thigh.  Pain is worse with movement, no relief with Motrin at home.  Patient states he had a similar injury a few months ago which had resolved.  He denies abdominal pain, groin numbness, loss of bowel or bladder control.  No other complaints or concerns.     History reviewed. No pertinent past medical history.  Patient Active Problem List   Diagnosis Date Noted  . Right hand pain 08/21/2018  . Shoulder pain 06/24/2012    Past Surgical History:  Procedure Laterality Date  . right shoulder surgery          Home Medications    Prior to Admission medications   Medication Sig Start Date End Date Taking? Authorizing Provider  diclofenac sodium (VOLTAREN) 1 % GEL Apply 2 g topically 4 (four) times daily. 08/21/18   Cristie Hem, PA-C  fluticasone (FLONASE) 50 MCG/ACT nasal spray Place 2 sprays into both nostrils daily. 06/01/18   Claiborne Rigg, NP  meloxicam (MOBIC) 7.5 MG tablet Take 1 tablet (7.5 mg total) by mouth daily for 10 days. 08/31/18 09/10/18  Jeannie Fend, PA-C  orphenadrine (NORFLEX) 100 MG tablet Take 1 tablet (100 mg total) by mouth 2 (two) times daily for 10 days. 08/31/18 09/10/18  Jeannie Fend, PA-C    Family History Family History  Problem Relation Age of Onset  . Healthy Mother   . Healthy Father     Social History Social History   Tobacco Use  . Smoking status: Never Smoker  . Smokeless tobacco: Never Used  Substance Use Topics  . Alcohol use: Never    Frequency:  Never  . Drug use: Never     Allergies   Patient has no known allergies.   Review of Systems Review of Systems  Constitutional: Negative for fever.  Gastrointestinal: Negative for abdominal pain, constipation and diarrhea.  Genitourinary: Negative for difficulty urinating.  Musculoskeletal: Positive for back pain.  Skin: Negative for rash and wound.  Allergic/Immunologic: Negative for immunocompromised state.  Neurological: Negative for weakness and numbness.  Psychiatric/Behavioral: Negative for confusion.  All other systems reviewed and are negative.    Physical Exam Updated Vital Signs BP (!) 129/98 (BP Location: Right Arm)   Pulse 72   Temp 98.7 F (37.1 C) (Oral)   Resp 16   SpO2 100%   Physical Exam  Constitutional: He is oriented to person, place, and time. He appears well-developed and well-nourished. No distress.  HENT:  Head: Normocephalic and atraumatic.  Cardiovascular: Intact distal pulses.  Pulmonary/Chest: Effort normal.  Abdominal: Soft. He exhibits no distension. There is no tenderness.  Musculoskeletal: He exhibits tenderness. He exhibits no deformity.       Lumbar back: He exhibits decreased range of motion, tenderness and pain. He exhibits no bony tenderness, no swelling, no edema and no deformity.       Back:  Neurological: He is alert and oriented to person, place, and time. He has normal strength.  He displays normal reflexes. No sensory deficit.  Reflex Scores:      Patellar reflexes are 1+ on the right side and 1+ on the left side. Skin: Skin is warm and dry. He is not diaphoretic.  Psychiatric: He has a normal mood and affect. His behavior is normal.  Nursing note and vitals reviewed.    ED Treatments / Results  Labs (all labs ordered are listed, but only abnormal results are displayed) Labs Reviewed - No data to display  EKG None  Radiology No results found.  Procedures Procedures (including critical care time)  Medications  Ordered in ED Medications  ketorolac (TORADOL) 15 MG/ML injection 15 mg (15 mg Intramuscular Given 08/31/18 0958)     Initial Impression / Assessment and Plan / ED Course  I have reviewed the triage vital signs and the nursing notes.  Pertinent labs & imaging results that were available during my care of the patient were reviewed by me and considered in my medical decision making (see chart for details).  Clinical Course as of Aug 31 1008  Mon Aug 31, 2018  387 42 year old otherwise healthy male with left lower back pain after lifting a heavy box 2 days ago.  On exam has left lower back tenderness, reflexes symmetric, left straight leg raise positive, no contralateral pain.  Patient given Toradol while in the ER, discharged home with Norflex and meloxicam, recommend stretches and warm compresses and follow-up with PCP.   [LM]    Clinical Course User Index [LM] Jeannie Fend, PA-C    Final Clinical Impressions(s) / ED Diagnoses   Final diagnoses:  Strain of lumbar region, initial encounter  Sciatica of left side    ED Discharge Orders         Ordered    orphenadrine (NORFLEX) 100 MG tablet  2 times daily     08/31/18 1003    meloxicam (MOBIC) 7.5 MG tablet  Daily     08/31/18 1003           Jeannie Fend, PA-C 08/31/18 1009    Maia Plan, MD 08/31/18 1839

## 2018-09-16 NOTE — Progress Notes (Signed)
Patient ID: Jay Wright, male   DOB: 1976-07-29, 42 y.o.   MRN: [id number]      [facility], is a 42 y.o. male  [id number]:[id number]  [id number]  DOB - 05-02-1976  Subjective:  Chief Complaint and HPI: Jay Wright is a 42 y.o. male here today for a follow up visit After being seen in the ED 08/31/2018 for Left low back pain after lifting a box.  He was prescribed Norflex and meloxicam and told to f/up here.  He does not feel that he is getting any better.  He c/o walking with a limp favoring his R leg.  He c/o paresthesias in L leg.  +difficulty sleeping.  No problems moving bowels or bladder.  meds not helping.  From ED A/P: 42 year old otherwise healthy male with left lower back pain after lifting a heavy box 2 days ago.  On exam has left lower back tenderness, reflexes symmetric, left straight leg raise positive, no contralateral pain.  Patient given Toradol while in the ER, discharged home with Norflex and meloxicam, recommend stretches and warm compresses and follow-up with PCP.   ED/Hospital notes reviewed and summarized above     ROS:   Constitutional:  No f/c, No night sweats, No unexplained weight loss. EENT:  No vision changes, No blurry vision, No hearing changes. No mouth, throat, or ear problems.  Respiratory: No cough, No SOB Cardiac: No CP, no palpitations GI:  No abd pain, No N/V/D. GU: No Urinary s/sx Musculoskeletal: L back and leg pain/paresthesias Neuro: No headache, no dizziness, no motor weakness.  Skin: No rash Endocrine:  No polydipsia. No polyuria.  Psych: Denies SI/HI  No problems updated.  ALLERGIES: No Known Allergies  PAST MEDICAL HISTORY: History reviewed. No pertinent past medical history.  MEDICATIONS AT HOME: Prior to Admission medications   Medication Sig Start Date End Date Taking? Authorizing Provider  diclofenac sodium (VOLTAREN) 1 % GEL Apply 2 g topically 4 (four) times daily. 08/21/18  Yes Cristie Hem, PA-C  fluticasone (FLONASE) 50  MCG/ACT nasal spray Place 2 sprays into both nostrils daily. 06/01/18  Yes Claiborne Rigg, NP  methocarbamol (ROBAXIN) 500 MG tablet Take 2 tablets (1,000 mg total) by mouth 3 (three) times daily. X 10 days then prn pain 09/17/18   Georgian Co M, PA-C  naproxen (NAPROSYN) 500 MG tablet Take 1 tablet (500 mg total) by mouth 2 (two) times daily with a meal. X 10 days then prn pain 09/17/18   Anders Simmonds, PA-C     Objective:  EXAM:   Vitals:   09/17/18 0845  BP: 113/78  Pulse: 86  Resp: 18  Temp: 98.2 F (36.8 C)  TempSrc: Oral  SpO2: 99%  Weight: 146 lb (66.2 kg)  Height: 5\' 8"  (1.727 m)    General appearance : A&OX3. NAD. Non-toxic-appearing HEENT: Atraumatic and Normocephalic.  PERRLA. EOM intact.   Neck: supple, no JVD. No cervical lymphadenopathy. No thyromegaly Chest/Lungs:  Breathing-non-labored, Good air entry bilaterally, breath sounds normal without rales, rhonchi, or wheezing  CVS: S1 S2 regular, no murmurs, gallops, rubs  Back:  Full S&ROM, he is walking favoring his R leg.  He has TTP paraspinus on the L>R.  Neg SLR B.  +TTP L S-I joint.  Decreased sensation on L lateral lower leg.  LE DTR=inatct B. Extremities: Bilateral Lower Ext shows no edema, both legs are warm to touch with = pulse throughout Neurology:  CN II-XII grossly intact, Non focal.   Psych:  TP linear. J/I WNL. Normal speech. Appropriate eye contact and affect.  Skin:  No Rash  Data Review No results found for: HGBA1C   Assessment & Plan   1. Acute left-sided low back pain without sciatica Some radicular s/sx - DG Lumbar Spine Complete; Future - naproxen (NAPROSYN) 500 MG tablet; Take 1 tablet (500 mg total) by mouth 2 (two) times daily with a meal. X 10 days then prn pain  Dispense: 60 tablet; Refill: 0 - methylPREDNISolone sodium succinate (SOLU-MEDROL) 125 mg/2 mL injection 125 mg - methocarbamol (ROBAXIN) 500 MG tablet; Take 2 tablets (1,000 mg total) by mouth 3 (three) times daily. X  10 days then prn pain  Dispense: 90 tablet; Refill: 0  2. Hospital discharge follow-up No change  3. Sacroiliac inflammation (HCC) - DG Si Joints; Future - naproxen (NAPROSYN) 500 MG tablet; Take 1 tablet (500 mg total) by mouth 2 (two) times daily with a meal. X 10 days then prn pain  Dispense: 60 tablet; Refill: 0 - methylPREDNISolone sodium succinate (SOLU-MEDROL) 125 mg/2 mL injection 125 mg(glucose on 06/01/2018=85) - methocarbamol (ROBAXIN) 500 MG tablet; Take 2 tablets (1,000 mg total) by mouth 3 (three) times daily. X 10 days then prn pain  Dispense: 90 tablet; Refill: 0  4. Paresthesia - DG Lumbar Spine Complete; Future - DG Si Joints; Future  Patient have been counseled extensively about nutrition and exercise  Return for keep 10/07/2018 appt with Bertram Denver.  The patient was given clear instructions to go to ER or return to medical center if symptoms don't improve, worsen or new problems develop. The patient verbalized understanding. The patient was told to call to get lab results if they haven't heard anything in the next week.     Georgian Co, PA-C [facility] [place], [place] [phone]   09/17/2018, 8:53 AM

## 2018-09-17 ENCOUNTER — Ambulatory Visit: Payer: Self-pay | Attending: Nurse Practitioner | Admitting: Physician Assistant

## 2018-09-17 VITALS — BP 113/78 | HR 86 | Temp 98.2°F | Resp 18 | Ht 68.0 in | Wt 146.0 lb

## 2018-09-17 DIAGNOSIS — Z7951 Long term (current) use of inhaled steroids: Secondary | ICD-10-CM | POA: Insufficient documentation

## 2018-09-17 DIAGNOSIS — M461 Sacroiliitis, not elsewhere classified: Secondary | ICD-10-CM

## 2018-09-17 DIAGNOSIS — Z09 Encounter for follow-up examination after completed treatment for conditions other than malignant neoplasm: Secondary | ICD-10-CM

## 2018-09-17 DIAGNOSIS — M545 Low back pain, unspecified: Secondary | ICD-10-CM

## 2018-09-17 DIAGNOSIS — M533 Sacrococcygeal disorders, not elsewhere classified: Secondary | ICD-10-CM | POA: Insufficient documentation

## 2018-09-17 DIAGNOSIS — R202 Paresthesia of skin: Secondary | ICD-10-CM | POA: Insufficient documentation

## 2018-09-17 DIAGNOSIS — Z79899 Other long term (current) drug therapy: Secondary | ICD-10-CM | POA: Insufficient documentation

## 2018-09-17 MED ORDER — METHYLPREDNISOLONE SODIUM SUCC 125 MG IJ SOLR
125.0000 mg | Freq: Once | INTRAMUSCULAR | Status: AC
  Administered 2018-09-17: 125 mg via INTRAMUSCULAR

## 2018-09-17 MED ORDER — NAPROXEN 500 MG PO TABS: 500.0000 mg | ORAL_TABLET | Freq: Two times a day (BID) | ORAL | 0 refills | Status: DC

## 2018-09-17 MED ORDER — METHOCARBAMOL 500 MG PO TABS: 1000.0000 mg | ORAL_TABLET | Freq: Three times a day (TID) | ORAL | 0 refills | Status: DC

## 2018-09-18 ENCOUNTER — Ambulatory Visit (HOSPITAL_COMMUNITY)
Admission: RE | Admit: 2018-09-18 | Discharge: 2018-09-18 | Disposition: A | Discharge status: Home / Self Care | Payer: Medicaid Other | Source: Ambulatory Visit | Attending: Physician Assistant | Admitting: Physician Assistant

## 2018-09-18 DIAGNOSIS — R202 Paresthesia of skin: Secondary | ICD-10-CM

## 2018-09-18 DIAGNOSIS — M545 Low back pain, unspecified: Secondary | ICD-10-CM

## 2018-09-18 DIAGNOSIS — M461 Sacroiliitis, not elsewhere classified: Secondary | ICD-10-CM

## 2018-09-23 ENCOUNTER — Telehealth: Payer: Self-pay | Admitting: *Deleted

## 2018-09-23 NOTE — Telephone Encounter (Signed)
-----   Message from Anders Simmonds, [place] sent at 09/21/2018  2:44 PM EDT ----- Please call patient.  His back and sacro-iliac x-rays showed minimal arthritis but were overall normal.  Follow-up as planned.  Thanks, Georgian Co, PA-C

## 2018-09-23 NOTE — Telephone Encounter (Signed)
Medical Assistant used [employer] to contact patient.  Interpreter Name: Bonney Aid #: [id number] Patient is aware of minimal arthritis being noted but other than that normal. Patient should follow up as planned and continue muscular pain medication. No further questions.

## 2018-10-07 ENCOUNTER — Ambulatory Visit: Payer: Medicaid Other | Admitting: Nurse Practitioner

## 2018-11-02 ENCOUNTER — Ambulatory Visit: Payer: Self-pay | Attending: Nurse Practitioner | Admitting: Nurse Practitioner

## 2018-11-02 ENCOUNTER — Encounter: Payer: Self-pay | Admitting: Nurse Practitioner

## 2018-11-02 VITALS — BP 102/72 | HR 65 | Temp 98.5°F | Ht 68.0 in | Wt 147.0 lb

## 2018-11-02 DIAGNOSIS — Z79899 Other long term (current) drug therapy: Secondary | ICD-10-CM | POA: Insufficient documentation

## 2018-11-02 DIAGNOSIS — Z9889 Other specified postprocedural states: Secondary | ICD-10-CM | POA: Insufficient documentation

## 2018-11-02 DIAGNOSIS — R202 Paresthesia of skin: Secondary | ICD-10-CM | POA: Insufficient documentation

## 2018-11-02 DIAGNOSIS — M79641 Pain in right hand: Secondary | ICD-10-CM | POA: Insufficient documentation

## 2018-11-02 NOTE — Progress Notes (Signed)
Assessment & Plan:  Jay Wright was seen today for follow-up.  Diagnoses and all orders for this visit:  Right hand paresthesia -     Ambulatory referral to Orthopedic Surgery    Patient has been counseled on age-appropriate routine health concerns for screening and prevention. These are reviewed and up-to-date. Referrals have been placed accordingly. Immunizations are up-to-date or declined.    Subjective:   Chief Complaint  Patient presents with  . Follow-up    Pt. stated his right hand numbness is still there but better. He seen an orthropedic and they had gave him a cream that helped sometimes.    HPI Jay Wright 42 y.o. male presents to office today for follow up of right hand pain. He was diagnosed with soft tissue contusion by ortho on 08-21-2018. He was prescribed diclofenac gel at that time and today endorses almost complete resolution of his right hand pain however he does continue to endorse paresthesia and numbness of the right 4th and 5th finger. The sensation is only present when he "taps" on the dorsal or palmar side of his right hand. He states this is not a new finding. May likely be related to carpal tunnel. He denies any injury or trauma in the past to his right hand.  Will refer to hand surgery.   Review of Systems  Constitutional: Negative for fever, malaise/fatigue and weight loss.  HENT: Negative.  Negative for nosebleeds.   Eyes: Negative.  Negative for blurred vision, double vision and photophobia.  Respiratory: Negative.  Negative for cough and shortness of breath.   Cardiovascular: Negative.  Negative for chest pain, palpitations and leg swelling.  Gastrointestinal: Negative.  Negative for heartburn, nausea and vomiting.  Musculoskeletal: Negative.  Negative for myalgias.  Neurological: Positive for tingling and sensory change. Negative for dizziness, focal weakness, seizures and headaches.  Psychiatric/Behavioral: Negative.  Negative for suicidal  ideas.    History reviewed. No pertinent past medical history.  Past Surgical History:  Procedure Laterality Date  . right shoulder surgery      Family History  Problem Relation Age of Onset  . Healthy Mother   . Healthy Father     Social History Reviewed with no changes to be made today.   Outpatient Medications Prior to Visit  Medication Sig Dispense Refill  . diclofenac sodium (VOLTAREN) 1 % GEL Apply 2 g topically 4 (four) times daily. 1 Tube 1  . fluticasone (FLONASE) 50 MCG/ACT nasal spray Place 2 sprays into both nostrils daily. 16 g 6  . methocarbamol (ROBAXIN) 500 MG tablet Take 2 tablets (1,000 mg total) by mouth 3 (three) times daily. X 10 days then prn pain (Patient not taking: Reported on 11/02/2018) 90 tablet 0  . naproxen (NAPROSYN) 500 MG tablet Take 1 tablet (500 mg total) by mouth 2 (two) times daily with a meal. X 10 days then prn pain (Patient not taking: Reported on 11/02/2018) 60 tablet 0   No facility-administered medications prior to visit.     No Known Allergies     Objective:    BP 102/72 (BP Location: Left Arm, Patient Position: Sitting, Cuff Size: Normal)   Pulse 65   Temp 98.5 F (36.9 C) (Oral)   Ht 5\' 8"  (1.727 m)   Wt 147 lb (66.7 kg)   SpO2 99%   BMI 22.35 kg/m  Wt Readings from Last 3 Encounters:  11/02/18 147 lb (66.7 kg)  09/17/18 146 lb (66.2 kg)  08/12/18 151 lb 3.2  oz (68.6 kg)    Physical Exam  Constitutional: He is oriented to person, place, and time. He appears well-developed and well-nourished. He is cooperative.  HENT:  Head: Normocephalic and atraumatic.  Eyes: EOM are normal.  Neck: Normal range of motion.  Cardiovascular: Normal rate, regular rhythm and normal heart sounds. Exam reveals no gallop and no friction rub.  No murmur heard. Pulmonary/Chest: Effort normal and breath sounds normal. No tachypnea. No respiratory distress. He has no decreased breath sounds. He has no wheezes. He has no rhonchi. He has no  rales. He exhibits no tenderness.  Abdominal: Soft. Bowel sounds are normal.  Musculoskeletal: Normal range of motion. He exhibits no edema.       Right hand: He exhibits normal range of motion, no tenderness, normal capillary refill, no laceration and no swelling. Decreased sensation noted. Normal strength noted.  Negative phalen  Neurological: He is alert and oriented to person, place, and time. Coordination normal.  Skin: Skin is warm and dry.  Psychiatric: He has a normal mood and affect. His behavior is normal. Judgment and thought content normal.  Nursing note and vitals reviewed.        Patient has been counseled extensively about nutrition and exercise as well as the importance of adherence with medications and regular follow-up. The patient was given clear instructions to go to ER or return to medical center if symptoms don't improve, worsen or new problems develop. The patient verbalized understanding.   Follow-up: Return if symptoms worsen or fail to improve.   Claiborne Rigg, FNP-BC [facility] [place], [place] [phone]   11/02/2018, 2:52 PM

## 2018-11-02 NOTE — Patient Instructions (Signed)
Dr. Roda Shutters  [phone]   Facet Syndrome Facet syndrome is a condition in which joints (facet joints) that connect the bones of the spine (vertebrae) become damaged. Facet joints help the spine move, and they usually wear down (degenerate) or become inflamed as you age. This can cause pain and stiffness in the neck (cervical facet syndrome) or in the lower back (lumbar facet syndrome). When a facet joint becomes damaged, a vertebra may slip forward, out of its normal place in the spine. Damage to a facet joint can also damage nerves near the spine, which can cause tingling or weakness in the arms or legs. Facet syndrome can make it difficult to turn the head or bend backward without pain. This condition typically gets worse over time. What are the causes? Common causes of this condition include:  Age-related inflammation of the facet joints (arthritis) that may create extra bone on the joint surface (bone spurs).  Age-related decrease in space between the vertebrae (disk degeneration and cartilage degeneration).  Repetitive stress on the spine, such as repetitive twisting of the back.  Injury (trauma) to the back or neck.  What increases the risk? The following factors may make you more likely to develop this condition:  Playing contact sports.  Doing activities or sports that involve repetitive twisting motions or repetitive heavy lifting.  Having poor back strength and flexibility.  Having another back or spine condition, such as scoliosis.  What are the signs or symptoms? Symptoms of facet syndrome may include:  An ache in the neck or lower back. This may get worse when you twist or arch your back, or when you look up.  Stiffness in the neck or lower back.  Numbness, tingling, or weakness in the arms or legs.  Symptoms of cervical facet syndrome may include:  Headache.  Pain at the back of the head.  Pain in the shoulder blades.  Symptoms of lumbar facet syndrome may  include pain in any of the following areas:  Groin.  Thighs.  Lower back.  Buttocks.  Hips.  How is this diagnosed? This condition may be diagnosed based on:  Your symptoms.  Your medical history.  A physical exam.  Imaging tests, such as: ? X-rays. ? MRI.  A procedure in which medicines to numb the area (local anesthetic) and medicines to reduce inflammation (steroids) are injected into your affected joint (facet joint block).  How is this treated? Treatment for this condition may include:  Stopping or modifying activities that make your condition worse.  Medicines that help reduce pain and inflammation.  Steroid injections to help reduce severe pain.  Physical therapy.  Radiofrequency ablation. This is a surgical procedure that uses high-frequency radio waves to block signals from affected nerves.  Surgery to stabilize your spine or to take pressure off your nerves. This is rare.  Follow these instructions at home: Activity  Rest your neck and back as told by your health care provider.  Return to your normal activities as told by your health care provider. Ask your health care provider what activities are safe for you.  If physical therapy was prescribed, do exercises as told by your health care provider. General instructions  Take over-the-counter and prescription medicines only as told by your health care provider.  Do not drive or operate heavy machinery while taking prescription pain medicines.  Do not use any tobacco products, such as cigarettes, chewing tobacco, and e-cigarettes. Tobacco can delay bone healing. If you need help quitting, ask your  health care provider.  Use good posture throughout your daily activities. Good posture means that your spine is in its natural S-curve position (your spine is neutral), your shoulders are pulled back slightly, and your head is not forward.  Keep all follow-up visits as told by your health care provider.  This is important. Contact a health care provider if:  You have symptoms that get worse or do not improve in 2-4 weeks of treatment.  You have numbness or weakness in any part of your body.  You lose control over your bladder or bowel function. This information is not intended to replace advice given to you by your health care provider. Make sure you discuss any questions you have with your health care provider. Document Released: 12/09/2005 Document Revised: 01/10/2016 Document Reviewed: 08/21/2015 Elsevier Interactive Patient Education  [employer].

## 2018-11-10 ENCOUNTER — Ambulatory Visit (INDEPENDENT_AMBULATORY_CARE_PROVIDER_SITE_OTHER): Payer: Self-pay | Admitting: Orthopaedic Surgery

## 2018-11-10 ENCOUNTER — Encounter (INDEPENDENT_AMBULATORY_CARE_PROVIDER_SITE_OTHER): Payer: Self-pay | Admitting: Orthopaedic Surgery

## 2018-11-10 DIAGNOSIS — M79641 Pain in right hand: Secondary | ICD-10-CM

## 2018-11-10 NOTE — Progress Notes (Signed)
      Patient: Eston Abdelhadi Hamad Ahmed           Date of Birth: [birth year]           MRN: [id number] Visit Date: 11/10/2018 PCP: Claiborne RiggFleming, Zelda W, NP   Assessment & Plan:  Chief Complaint:  Chief Complaint  Patient presents with  . Right Hand - Follow-up    Numbness in 4th & 5th digits since accident approx 3 months ago.   Visit Diagnoses:  1. Right hand pain     Plan: Patient is a pleasant 42 year old gentleman who presents to our clinic today optimally 3-1/2 months out right hand soft tissue injury which occurred at work.  His pain has gradually improved, but he still notes quite a bit of numbness to the right ring and small fingers.  This has improved very little over the past 3-1/2 months.  Examination of the right hand reveals full motor function.  Decreased sensation to the right ring and small fingers.  Negative Tinel at the elbow.  At this point, we will go ahead and obtain a nerve conduction study/EMG right upper extremity to further assess the ulnar nerve.  He will follow-up with us once that has been completed.  Follow-Up Instructions: Return in about 2 weeks (around 11/24/2018) for to discuss NCS/EMG.   Orders:  Orders Placed This Encounter  Procedures  . Ambulatory referral to Physical Medicine Rehab   No orders of the defined types were placed in this encounter.   Imaging: No new imaging  PMFS History: Patient Active Problem List   Diagnosis Date Noted  . Right hand pain 08/21/2018  . Shoulder pain 06/24/2012   History reviewed. No pertinent past medical history.  Family History  Problem Relation Age of Onset  . Healthy Mother   . Healthy Father     Past Surgical History:  Procedure Laterality Date  . right shoulder surgery     Social History   Occupational History  . Not on file  Tobacco Use  . Smoking status: Never Smoker  . Smokeless tobacco: Never Used  Substance and Sexual Activity  . Alcohol use: Never    Frequency: Never  . Drug  use: Never  . Sexual activity: Yes

## 2018-11-27 ENCOUNTER — Encounter (INDEPENDENT_AMBULATORY_CARE_PROVIDER_SITE_OTHER): Payer: Self-pay | Admitting: Physical Medicine and Rehabilitation

## 2018-12-03 ENCOUNTER — Ambulatory Visit (INDEPENDENT_AMBULATORY_CARE_PROVIDER_SITE_OTHER): Payer: Self-pay | Admitting: Physical Medicine and Rehabilitation

## 2018-12-03 ENCOUNTER — Encounter (INDEPENDENT_AMBULATORY_CARE_PROVIDER_SITE_OTHER): Payer: Self-pay | Admitting: Physical Medicine and Rehabilitation

## 2018-12-03 DIAGNOSIS — R202 Paresthesia of skin: Secondary | ICD-10-CM

## 2018-12-03 NOTE — Progress Notes (Signed)
.  Numeric Pain Rating Scale and Functional Assessment Average Pain 2   In the last MONTH (on 0-10 scale) has pain interfered with the following?  1. General activity like being  able to carry out your everyday physical activities such as walking, climbing stairs, carrying groceries, or moving a chair?  Rating(5)

## 2018-12-03 NOTE — Progress Notes (Signed)
Jay Wright - 42 y.o. male MRN [id number]  Date of birth: 1976/02/15  Office Visit Note: Visit Date: 12/03/2018 PCP: Claiborne RiggFleming, Zelda W, NP Referred by: Claiborne RiggFleming, Zelda W, NP  Subjective: Chief Complaint  Patient presents with  . Right Hand - Numbness, Pain   HPI: Jay Wright is a 42 y.o. male who comes in today At the request Dr. Glee ArvinMichael Xu for electrodiagnostic study of the right upper limb.  Patient states injury 3 months ago and since that time he has reported numbness and tingling particularly in the right hand in the medial half of the fourth digit and all of the fifth digit as well as intermittent tingling numbness if tapping or touching the palm of his hand on the ulnar side.  He reports worsening with using the right hand and some massaging of the hand seems to help.  He is right-hand dominant denies any left-sided complaints.  He feels more numbness than pain.  He has no complaints of radicular arm pain.  He has not had prior electrodiagnostic studies.  ROS Otherwise per HPI.  Assessment & Plan: Visit Diagnoses:  1. Paresthesia of skin     Plan: Impression: The above electrodiagnostic study is ABNORMAL and reveals evidence of:  1.  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  Location of the lesion is difficult to because there is distal slowing of the ulnar nerve but no motor slowing across the elbow.  Clinically this does fit with his symptoms and he has fairly classic decreased sensation on the ulnar side of the fourth digit not on the radial side.  He does however have some Tinel's at the right elbow.  Lastly there may be some temperature artifact involved.  2.  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  Again there may be some temperature artifact involved.   There is no significant electrodiagnostic evidence of any other focal nerve entrapment, brachial plexopathy or cervical radiculopathy.    Recommendations: 1.  Follow-up with referring physician. 2.  Continue current management of symptoms.   Meds & Orders: No orders of the defined types were placed in this encounter.   Orders Placed This Encounter  Procedures  . NCV with EMG (electromyography)    Follow-up: Return for  Glee ArvinMichael Xu, M.D..   Procedures:  EMG & NCV Findings: Evaluation of the right median (across palm) sensory nerve showed prolonged distal peak latency (Wrist, 3.8 ms) and prolonged distal peak latency (Palm, 2.3 ms).  The right ulnar sensory nerve showed prolonged distal peak latency (4.0 ms) and decreased conduction velocity (Wrist-5th Digit, 35 m/s).  All remaining nerves (as indicated in the following tables) were within normal limits.    All examined muscles (as indicated in the following table) showed no evidence of electrical instability.    Impression: The above electrodiagnostic study is ABNORMAL and reveals evidence of:  1.  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  Location of the lesion is difficult to because there is distal slowing of the ulnar nerve but no motor slowing across the elbow.  Clinically this does fit with his symptoms and he has fairly classic decreased sensation on the ulnar side of the fourth digit not on the radial side.  He does however have some Tinel's at the right elbow.  Lastly there may be some temperature artifact involved.  2.  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  Again there may be some  temperature artifact involved.   There is no significant electrodiagnostic evidence of any other focal nerve entrapment, brachial plexopathy or cervical radiculopathy.   Recommendations: 1.  Follow-up with referring physician. 2.  Continue current management of symptoms.  ___________________________ Naaman Plummer FAAPMR Board Certified, American Board of Physical Medicine and Rehabilitation    Nerve Conduction Studies Anti  Sensory Summary Table   Stim Site NR Peak (ms) Norm Peak (ms) P-T Amp (V) Norm P-T Amp Site1 Site2 Delta-P (ms) Dist (cm) Vel (m/s) Norm Vel (m/s)  Right Median Acr Palm Anti Sensory (2nd Digit)  30C  Wrist    *3.8 <3.6 26.1 >10 Wrist Palm 1.5 0.0    Palm    *2.3 <2.0 31.1         Right Radial Anti Sensory (Base 1st Digit)  30.3C  Wrist    2.5 <3.1 23.9  Wrist Base 1st Digit 2.5 0.0    Right Ulnar Anti Sensory (5th Digit)  30.1C  Wrist    *4.0 <3.7 18.1 >15.0 Wrist 5th Digit 4.0 14.0 *35 >38   Motor Summary Table   Stim Site NR Onset (ms) Norm Onset (ms) O-P Amp (mV) Norm O-P Amp Site1 Site2 Delta-0 (ms) Dist (cm) Vel (m/s) Norm Vel (m/s)  Right Median Motor (Abd Poll Brev)  30.1C  Wrist    3.5 <4.2 10.6 >5 Elbow Wrist 4.5 23.2 52 >50  Elbow    8.0  10.7         Right Ulnar Motor (Abd Dig Min)  30.1C  Wrist    3.7 <4.2 12.7 >3 B Elbow Wrist 4.1 23.5 57 >53  B Elbow    7.8  12.5  A Elbow B Elbow 1.4 10.0 71 >53  A Elbow    9.2  12.4          EMG   Side Muscle Nerve Root Ins Act Fibs Psw Amp Dur Poly Recrt Int Dennie Bible Comment  Right Abd Poll Brev Median C8-T1 Nml Nml Nml Nml Nml 0 Nml Nml   Right 1stDorInt Ulnar C8-T1 Nml Nml Nml Nml Nml 0 Nml Nml   Right PronatorTeres Median C6-7 Nml Nml Nml Nml Nml 0 Nml Nml   Right Biceps Musculocut C5-6 Nml Nml Nml Nml Nml 0 Nml Nml   Right Deltoid Axillary C5-6 Nml Nml Nml Nml Nml 0 Nml Nml     Nerve Conduction Studies Anti Sensory Left/Right Comparison   Stim Site L Lat (ms) R Lat (ms) L-R Lat (ms) L Amp (V) R Amp (V) L-R Amp (%) Site1 Site2 L Vel (m/s) R Vel (m/s) L-R Vel (m/s)  Median Acr Palm Anti Sensory (2nd Digit)  30C  Wrist  *3.8   26.1  Wrist Palm     Palm  *2.3   31.1        Radial Anti Sensory (Base 1st Digit)  30.3C  Wrist  2.5   23.9  Wrist Base 1st Digit     Ulnar Anti Sensory (5th Digit)  30.1C  Wrist  *4.0   18.1  Wrist 5th Digit  *35    Motor Left/Right Comparison   Stim Site L Lat (ms) R Lat (ms) L-R Lat (ms)  L Amp (mV) R Amp (mV) L-R Amp (%) Site1 Site2 L Vel (m/s) R Vel (m/s) L-R Vel (m/s)  Median Motor (Abd Poll Brev)  30.1C  Wrist  3.5   10.6  Elbow Wrist  52   Elbow  8.0   10.7  Ulnar Motor (Abd Dig Min)  30.1C  Wrist  3.7   12.7  B Elbow Wrist  57   B Elbow  7.8   12.5  A Elbow B Elbow  71   A Elbow  9.2   12.4           Waveforms:            Clinical History: No specialty comments available.   He reports that he has never smoked. He has never used smokeless tobacco. No results for input(s): HGBA1C, LABURIC in the last 8760 hours.  Objective:  VS:  HT:    WT:   BMI:     BP:   HR: bpm  TEMP: ( )  RESP:  Physical Exam Musculoskeletal:        General: No tenderness.     Comments: Inspection reveals no atrophy of the bilateral APB or FDI or hand intrinsics. There is no swelling, color changes, allodynia or dystrophic changes.  There is decreased sensation in a classic ulnar nerve distribution on the right with in particular decreased on the ulnar side of the fourth digit but not on the radial side.  There is 5 out of 5 strength in the bilateral wrist extension, finger abduction and long finger flexion.There is a negative Hoffmann's test bilaterally.  Patient has a positive Tinel's on her right elbow.  Skin:    General: Skin is warm and dry.     Findings: No erythema or rash.  Neurological:     Mental Status: He is alert and oriented to person, place, and time.     Sensory: Sensory deficit present.     Motor: No abnormal muscle tone.     Coordination: Coordination normal.     Comments: Decreased sensation in an ulnar nerve distribution on the right that includes the ulnar side of the fourth digit and all of the fifth digit.  He does have a positive Tinel's at the right elbow.     Ortho Exam Imaging: No results found.  Past Medical/Family/Surgical/Social History: Medications & Allergies reviewed per EMR, new medications updated. Patient Active Problem List    Diagnosis Date Noted  . Right hand pain 08/21/2018  . Shoulder pain 06/24/2012   History reviewed. No pertinent past medical history. Family History  Problem Relation Age of Onset  . Healthy Mother   . Healthy Father    Past Surgical History:  Procedure Laterality Date  . right shoulder surgery     Social History   Occupational History  . Not on file  Tobacco Use  . Smoking status: Never Smoker  . Smokeless tobacco: Never Used  Substance and Sexual Activity  . Alcohol use: Never    Frequency: Never  . Drug use: Never  . Sexual activity: Yes

## 2018-12-04 NOTE — Procedures (Signed)
EMG & NCV Findings: Evaluation of the right median (across palm) sensory nerve showed prolonged distal peak latency (Wrist, 3.8 ms) and prolonged distal peak latency (Palm, 2.3 ms).  The right ulnar sensory nerve showed prolonged distal peak latency (4.0 ms) and decreased conduction velocity (Wrist-5th Digit, 35 m/s).  All remaining nerves (as indicated in the following tables) were within normal limits.    All examined muscles (as indicated in the following table) showed no evidence of electrical instability.    Impression: The above electrodiagnostic study is ABNORMAL and reveals evidence of:  1.  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  Location of the lesion is difficult to because there is distal slowing of the ulnar nerve but no motor slowing across the elbow.  Clinically this does fit with his symptoms and he has fairly classic decreased sensation on the ulnar side of the fourth digit not on the radial side.  He does however have some Tinel's at the right elbow.  Lastly there may be some temperature artifact involved.  2.  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  Again there may be some temperature artifact involved.   There is no significant electrodiagnostic evidence of any other focal nerve entrapment, brachial plexopathy or cervical radiculopathy.   Recommendations: 1.  Follow-up with referring physician. 2.  Continue current management of symptoms.  ___________________________ Jay Wright FAAPMR Board Certified, American Board of Physical Medicine and Rehabilitation    Nerve Conduction Studies Anti Sensory Summary Table   Stim Site NR Peak (ms) Norm Peak (ms) P-T Amp (V) Norm P-T Amp Site1 Site2 Delta-P (ms) Dist (cm) Vel (m/s) Norm Vel (m/s)  Right Median Acr Palm Anti Sensory (2nd Digit)  30C  Wrist    *3.8 <3.6 26.1 >10 Wrist Palm 1.5 0.0    Palm    *2.3 <2.0 31.1         Right Radial Anti Sensory (Base 1st Digit)   30.3C  Wrist    2.5 <3.1 23.9  Wrist Base 1st Digit 2.5 0.0    Right Ulnar Anti Sensory (5th Digit)  30.1C  Wrist    *4.0 <3.7 18.1 >15.0 Wrist 5th Digit 4.0 14.0 *35 >38   Motor Summary Table   Stim Site NR Onset (ms) Norm Onset (ms) O-P Amp (mV) Norm O-P Amp Site1 Site2 Delta-0 (ms) Dist (cm) Vel (m/s) Norm Vel (m/s)  Right Median Motor (Abd Poll Brev)  30.1C  Wrist    3.5 <4.2 10.6 >5 Elbow Wrist 4.5 23.2 52 >50  Elbow    8.0  10.7         Right Ulnar Motor (Abd Dig Min)  30.1C  Wrist    3.7 <4.2 12.7 >3 B Elbow Wrist 4.1 23.5 57 >53  B Elbow    7.8  12.5  A Elbow B Elbow 1.4 10.0 71 >53  A Elbow    9.2  12.4          EMG   Side Muscle Nerve Root Ins Act Fibs Psw Amp Dur Poly Recrt Int Dennie Bible Comment  Right Abd Poll Brev Median C8-T1 Nml Nml Nml Nml Nml 0 Nml Nml   Right 1stDorInt Ulnar C8-T1 Nml Nml Nml Nml Nml 0 Nml Nml   Right PronatorTeres Median C6-7 Nml Nml Nml Nml Nml 0 Nml Nml   Right Biceps Musculocut C5-6 Nml Nml Nml Nml Nml 0 Nml Nml   Right Deltoid Axillary C5-6 Nml Nml Nml Nml Nml 0  Nml Nml     Nerve Conduction Studies Anti Sensory Left/Right Comparison   Stim Site L Lat (ms) R Lat (ms) L-R Lat (ms) L Amp (V) R Amp (V) L-R Amp (%) Site1 Site2 L Vel (m/s) R Vel (m/s) L-R Vel (m/s)  Median Acr Palm Anti Sensory (2nd Digit)  30C  Wrist  *3.8   26.1  Wrist Palm     Palm  *2.3   31.1        Radial Anti Sensory (Base 1st Digit)  30.3C  Wrist  2.5   23.9  Wrist Base 1st Digit     Ulnar Anti Sensory (5th Digit)  30.1C  Wrist  *4.0   18.1  Wrist 5th Digit  *35    Motor Left/Right Comparison   Stim Site L Lat (ms) R Lat (ms) L-R Lat (ms) L Amp (mV) R Amp (mV) L-R Amp (%) Site1 Site2 L Vel (m/s) R Vel (m/s) L-R Vel (m/s)  Median Motor (Abd Poll Brev)  30.1C  Wrist  3.5   10.6  Elbow Wrist  52   Elbow  8.0   10.7        Ulnar Motor (Abd Dig Min)  30.1C  Wrist  3.7   12.7  B Elbow Wrist  57   B Elbow  7.8   12.5  A Elbow B Elbow  71   A Elbow  9.2   12.4            Waveforms:

## 2018-12-07 ENCOUNTER — Ambulatory Visit: Payer: Self-pay | Attending: Nurse Practitioner

## 2018-12-11 ENCOUNTER — Ambulatory Visit (INDEPENDENT_AMBULATORY_CARE_PROVIDER_SITE_OTHER): Payer: Self-pay | Admitting: Orthopaedic Surgery

## 2018-12-28 ENCOUNTER — Telehealth: Payer: Self-pay | Admitting: Nurse Practitioner

## 2018-12-28 NOTE — Telephone Encounter (Signed)
LVM inform Pt that has been approve and taht will be mailling the letter today

## 2018-12-28 NOTE — Telephone Encounter (Signed)
Patient would like to check on the status of their cafa letter. Please follow up.

## 2019-01-01 ENCOUNTER — Encounter (INDEPENDENT_AMBULATORY_CARE_PROVIDER_SITE_OTHER): Payer: Self-pay | Admitting: Orthopaedic Surgery

## 2019-01-01 ENCOUNTER — Ambulatory Visit (INDEPENDENT_AMBULATORY_CARE_PROVIDER_SITE_OTHER): Payer: Self-pay | Admitting: Orthopaedic Surgery

## 2019-01-01 DIAGNOSIS — M79641 Pain in right hand: Secondary | ICD-10-CM

## 2019-01-01 MED ORDER — AMITRIPTYLINE HCL 10 MG PO TABS: 10.0000 mg | ORAL_TABLET | Freq: Every day | ORAL | 6 refills | Status: DC

## 2019-01-01 NOTE — Progress Notes (Signed)
   Office Visit Note   Patient: Jay Wright           Date of Birth: [birth year]           MRN: [id number] Visit Date: 01/01/2019              Requested by: Claiborne Rigg, NP [address] [place], [place] [address] PCP: Claiborne Rigg, NP   Assessment & Plan: Visit Diagnoses:  1. Right hand pain     Plan: Nerve conduction studies show a mild compression of the median nerve and ulnar nerve at the carpal tunnel and cubital tunnel however there is some temperature variation.  Overall the symptoms seem to be mild.  I recommend low-dose Elavil at night and a carpal tunnel brace to see if this will improve his symptoms.  I doubt that this warrants surgical intervention.  We could consider cortisone injections if he fails to receive any relief from these interventions.  Questions encouraged and answered.  Follow-up as needed  Follow-Up Instructions: Return if symptoms worsen or fail to improve.   Orders:  No orders of the defined types were placed in this encounter.  Meds ordered this encounter  Medications  . amitriptyline (ELAVIL) 10 MG tablet    Sig: Take 1 tablet (10 mg total) by mouth at bedtime.    Dispense:  30 tablet    Refill:  6      Procedures: No procedures performed   Clinical Data: No additional findings.   Subjective: Chief Complaint  Patient presents with  . Right Hand - Pain    Patient presents today for follow-up of his recent nerve conduction studies.   Review of Systems   Objective: Vital Signs: There were no vitals taken for this visit.  Physical Exam  Ortho Exam Exam is stable. Specialty Comments:  No specialty comments available.  Imaging: No results found.   PMFS History: Patient Active Problem List   Diagnosis Date Noted  . Right hand pain 08/21/2018  . Shoulder pain 06/24/2012   History reviewed. No pertinent past medical history.  Family History  Problem Relation Age of Onset  . Healthy Mother   .  Healthy Father     Past Surgical History:  Procedure Laterality Date  . right shoulder surgery     Social History   Occupational History  . Not on file  Tobacco Use  . Smoking status: Never Smoker  . Smokeless tobacco: Never Used  Substance and Sexual Activity  . Alcohol use: Never    Frequency: Never  . Drug use: Never  . Sexual activity: Yes

## 2019-01-21 ENCOUNTER — Ambulatory Visit: Payer: Self-pay | Attending: Family Medicine | Admitting: Physician Assistant

## 2019-01-21 VITALS — BP 114/75 | HR 86 | Temp 97.8°F | Ht 68.0 in | Wt 154.4 lb

## 2019-01-21 DIAGNOSIS — M62838 Other muscle spasm: Secondary | ICD-10-CM

## 2019-01-21 DIAGNOSIS — M546 Pain in thoracic spine: Secondary | ICD-10-CM

## 2019-01-21 MED ORDER — NAPROXEN 500 MG PO TABS: 500.0000 mg | ORAL_TABLET | Freq: Two times a day (BID) | ORAL | 0 refills | Status: DC

## 2019-01-21 MED ORDER — METHOCARBAMOL 500 MG PO TABS: 1000.0000 mg | ORAL_TABLET | Freq: Three times a day (TID) | ORAL | 0 refills | Status: DC

## 2019-01-21 NOTE — Progress Notes (Signed)
Patient ID: Jay Wright, male   DOB: 08/05/1976, 43 y.o.   MRN: [id number]   Jay Wright, is a 43 y.o. male  [id number]  [id number]  DOB - 08/05/1976  Subjective:  Chief Complaint and HPI: Jay Wright is a 43 y.o. male here today  With mid-thoracic back pain.  It has been going on for about 4 days.  NKI but he does do a lot of lifting at work.  No paresthesias.  No weakness.  Describes pain as sharp and moderate.  Worsened with movement.  No associated s/sx.  Hasn't taken anything to alleviate the pain.    Social Hx:  Married, works as Museum/gallery exhibitions officerforklift driver  ROS:   Constitutional:  No f/c, No night sweats, No unexplained weight loss. EENT:  No vision changes, No blurry vision, No hearing changes. No mouth, throat, or ear problems.  Respiratory: No cough, No SOB Cardiac: No CP, no palpitations GI:  No abd pain, No N/V/D. GU: No Urinary s/sx Musculoskeletal: see above Neuro: No headache, no dizziness, no motor weakness.  Skin: No rash Endocrine:  No polydipsia. No polyuria.  Psych: Denies SI/HI  No problems updated.  ALLERGIES: No Known Allergies  PAST MEDICAL HISTORY: No past medical history on file.  MEDICATIONS AT HOME: Prior to Admission medications   Medication Sig Start Date End Date Taking? Authorizing Provider  amitriptyline (ELAVIL) 10 MG tablet Take 1 tablet (10 mg total) by mouth at bedtime. 01/01/19  Yes Tarry KosXu, Naiping M, MD  diclofenac sodium (VOLTAREN) 1 % GEL Apply 2 g topically 4 (four) times daily. Patient not taking: Reported on 12/03/2018 08/21/18   Cristie HemStanbery, Mary L, PA-C  fluticasone [facility](FLONASE) 50 MCG/ACT nasal spray Place 2 sprays into both nostrils daily. Patient not taking: Reported on 12/03/2018 06/01/18   Claiborne RiggFleming, Zelda W, NP  methocarbamol (ROBAXIN) 500 MG tablet Take 2 tablets (1,000 mg total) by mouth 3 (three) times daily. X 10 days then prn pain 01/21/19   Georgian CoMcClung, Angela M, PA-C  naproxen (NAPROSYN) 500 MG tablet Take 1  tablet (500 mg total) by mouth 2 (two) times daily with a meal. X 10 days then prn pain 01/21/19   Anders SimmondsMcClung, Angela M, PA-C     Objective:  EXAM:   Vitals:   01/21/19 0857  BP: 114/75  Pulse: 86  Temp: 97.8 F (36.6 C)  TempSrc: Oral  SpO2: 100%  Weight: 154 lb 6.4 oz (70 kg)  Height: 5\' 8"  (1.727 m)    General appearance : A&OX3. NAD. Non-toxic-appearing HEENT: Atraumatic and Normocephalic.  PERRLA. EOM intact.  Chest/Lungs:  Breathing-non-labored, Good air entry bilaterally, breath sounds normal without rales, rhonchi, or wheezing  CVS: S1 S2 regular, no murmurs, gallops, rubs  Back:  ROM ~80% of normal.  TTP mid spine.  +paraspinus spasm in thoracic region.  Neg SLR B.  Full S&ROM U/L ext.   Extremities: U/L extremity DTR=intact B.  Bilateral Lower Ext shows no edema, both legs are warm to touch with = pulse throughout Neurology:  CN II-XII grossly intact, Non focal.   Psych:  TP linear. J/I WNL. Normal speech. Appropriate eye contact and affect.  Skin:  No Rash  Data Review No results found for: HGBA1C   Assessment & Plan   1. Acute midline thoracic back pain No red flags.  OOW today and tomorrow - DG Thoracic Spine 2 View; Future - naproxen (NAPROSYN) 500 MG tablet; Take 1 tablet (500 mg total) by mouth 2 (two) times daily  with a meal. X 10 days then prn pain  Dispense: 60 tablet; Refill: 0 - methocarbamol (ROBAXIN) 500 MG tablet; Take 2 tablets (1,000 mg total) by mouth 3 (three) times daily. X 10 days then prn pain  Dispense: 90 tablet; Refill: 0  2. Muscle spasm - naproxen (NAPROSYN) 500 MG tablet; Take 1 tablet (500 mg total) by mouth 2 (two) times daily with a meal. X 10 days then prn pain  Dispense: 60 tablet; Refill: 0 - methocarbamol (ROBAXIN) 500 MG tablet; Take 2 tablets (1,000 mg total) by mouth 3 (three) times daily. X 10 days then prn pain  Dispense: 90 tablet; Refill: 0   Patient have been counseled extensively about nutrition and exercise  Return if  symptoms worsen or fail to improve.  The patient was given clear instructions to go to ER or return to medical center if symptoms don't improve, worsen or new problems develop. The patient verbalized understanding. The patient was told to call to get lab results if they haven't heard anything in the next week.     Georgian CoAngela McClung, PA-C [facility] [place], [place] [phone]   01/21/2019, 9:15 AM

## 2019-01-22 ENCOUNTER — Ambulatory Visit (HOSPITAL_COMMUNITY)
Admission: RE | Admit: 2019-01-22 | Discharge: 2019-01-22 | Disposition: A | Discharge status: Home / Self Care | Payer: Medicaid Other | Source: Ambulatory Visit | Attending: Physician Assistant | Admitting: Physician Assistant

## 2019-01-22 DIAGNOSIS — M546 Pain in thoracic spine: Secondary | ICD-10-CM | POA: Insufficient documentation

## 2019-01-25 ENCOUNTER — Telehealth: Payer: Self-pay | Admitting: Nurse Practitioner

## 2019-01-25 ENCOUNTER — Telehealth: Payer: Self-pay

## 2019-01-25 NOTE — Telephone Encounter (Signed)
CMA spoke to patient to inform on Xray.  Patient verified DOB.  Pt. Understood.

## 2019-01-25 NOTE — Telephone Encounter (Signed)
-----   Message from Anders Simmonds, [place] sent at 01/25/2019 10:28 AM EST ----- Please call patient.  The xrays of his mid-back did not show any concerns or fractures.  Take meds and follow-up as planned.  Thanks, Georgian Co, PA-C

## 2019-01-25 NOTE — Telephone Encounter (Signed)
Pt called to request his x-ray results, please follow up when they are available

## 2019-01-25 NOTE — Telephone Encounter (Signed)
Patient was called and informed of lab results via interpreter([id number]) 

## 2019-02-18 ENCOUNTER — Ambulatory Visit: Payer: Self-pay | Attending: Family Medicine | Admitting: Physician Assistant

## 2019-02-18 VITALS — BP 112/75 | HR 67 | Temp 98.2°F | Resp 16 | Ht 68.0 in | Wt 151.2 lb

## 2019-02-18 DIAGNOSIS — J069 Acute upper respiratory infection, unspecified: Secondary | ICD-10-CM

## 2019-02-18 DIAGNOSIS — H6981 Other specified disorders of Eustachian tube, right ear: Secondary | ICD-10-CM

## 2019-02-18 MED ORDER — FLUTICASONE PROPIONATE 50 MCG/ACT NA SUSP: 2.0000 | Freq: Every day | NASAL | 6 refills | Status: DC

## 2019-02-18 MED ORDER — AZITHROMYCIN 250 MG PO TABS: ORAL_TABLET | ORAL | 0 refills | Status: DC

## 2019-02-18 NOTE — Patient Instructions (Signed)
Eustachian Tube Dysfunction  Eustachian tube dysfunction refers to a condition in which a blockage develops in the narrow passage that connects the middle ear to the back of the nose (eustachian tube). The eustachian tube regulates air pressure in the middle ear by letting air move between the ear and nose. It also helps to drain fluid from the middle ear space. Eustachian tube dysfunction can affect one or both ears. When the eustachian tube does not function properly, air pressure, fluid, or both can build up in the middle ear. What are the causes? This condition occurs when the eustachian tube becomes blocked or cannot open normally. Common causes of this condition include:  Ear infections.  Colds and other infections that affect the nose, mouth, and throat (upper respiratory tract).  Allergies.  Irritation from cigarette smoke.  Irritation from stomach acid coming up into the esophagus (gastroesophageal reflux). The esophagus is the tube that carries food from the mouth to the stomach.  Sudden changes in air pressure, such as from descending in an airplane or scuba diving.  Abnormal growths in the nose or throat, such as: ? Growths that line the nose (nasal polyps). ? Abnormal growth of cells (tumors). ? Enlarged tissue at the back of the throat (adenoids). What increases the risk? You are more likely to develop this condition if:  You smoke.  You are overweight.  You are a child who has: ? Certain birth defects of the mouth, such as cleft palate. ? Large tonsils or adenoids. What are the signs or symptoms? Common symptoms of this condition include:  A feeling of fullness in the ear.  Ear pain.  Clicking or popping noises in the ear.  Ringing in the ear.  Hearing loss.  Loss of balance.  Dizziness. Symptoms may get worse when the air pressure around you changes, such as when you travel to an area of high elevation, fly on an airplane, or go scuba diving. How is  this diagnosed? This condition may be diagnosed based on:  Your symptoms.  A physical exam of your ears, nose, and throat.  Tests, such as those that measure: ? The movement of your eardrum (tympanogram). ? Your hearing (audiometry). How is this treated? Treatment depends on the cause and severity of your condition.  In mild cases, you may relieve your symptoms by moving air into your ears. This is called "popping the ears."  In more severe cases, or if you have symptoms of fluid in your ears, treatment may include: ? Medicines to relieve congestion (decongestants). ? Medicines that treat allergies (antihistamines). ? Nasal sprays or ear drops that contain medicines that reduce swelling (steroids). ? A procedure to drain the fluid in your eardrum (myringotomy). In this procedure, a small tube is placed in the eardrum to:  Drain the fluid.  Restore the air in the middle ear space. ? A procedure to insert a balloon device through the nose to inflate the opening of the eustachian tube (balloon dilation). Follow these instructions at home: Lifestyle  Do not do any of the following until your health care provider approves: ? Travel to high altitudes. ? Fly in airplanes. ? Work in a pressurized cabin or room. ? Scuba dive.  Do not use any products that contain nicotine or tobacco, such as cigarettes and e-cigarettes. If you need help quitting, ask your health care provider.  Keep your ears dry. Wear fitted earplugs during showering and bathing. Dry your ears completely after. General instructions  Take over-the-counter   and prescription medicines only as told by your health care provider.  Use techniques to help pop your ears as recommended by your health care provider. These may include: ? Chewing gum. ? Yawning. ? Frequent, forceful swallowing. ? Closing your mouth, holding your nose closed, and gently blowing as if you are trying to blow air out of your nose.  Keep all  follow-up visits as told by your health care provider. This is important. Contact a health care provider if:  Your symptoms do not go away after treatment.  Your symptoms come back after treatment.  You are unable to pop your ears.  You have: ? A fever. ? Pain in your ear. ? Pain in your head or neck. ? Fluid draining from your ear.  Your hearing suddenly changes.  You become very dizzy.  You lose your balance. Summary  Eustachian tube dysfunction refers to a condition in which a blockage develops in the eustachian tube.  It can be caused by ear infections, allergies, inhaled irritants, or abnormal growths in the nose or throat.  Symptoms include ear pain, hearing loss, or ringing in the ears.  Mild cases are treated with maneuvers to unblock the ears, such as yawning or ear popping.  Severe cases are treated with medicines. Surgery may also be done (rare). This information is not intended to replace advice given to you by your health care provider. Make sure you discuss any questions you have with your health care provider. Document Released: 01/05/2016 Document Revised: 03/31/2018 Document Reviewed: 03/31/2018 Elsevier Interactive Patient Education  2019 [employer]vier [employer]. Upper Respiratory Infection, Adult An upper respiratory infection (URI) affects the nose, throat, and upper air passages. URIs are caused by germs (viruses). The most common type of URI is often called "the common cold." Medicines cannot cure URIs, but you can do things at home to relieve your symptoms. URIs usually get better within 7-10 days. Follow these instructions at home: Activity  Rest as needed.  If you have a fever, stay home from work or school until your fever is gone, or until your doctor says you may return to work or school. ? You should stay home until you cannot spread the infection anymore (you are not contagious). ? Your doctor may have you wear a face mask so you have less risk of  spreading the infection. Relieving symptoms  Gargle with a salt-water mixture 3-4 times a day or as needed. To make a salt-water mixture, completely dissolve -1 tsp of salt in 1 cup of warm water.  Use a cool-mist humidifier to add moisture to the air. This can help you breathe more easily. Eating and drinking   Drink enough fluid to keep your pee (urine) pale yellow.  Eat soups and other clear broths. General instructions   Take over-the-counter and prescription medicines only as told by your doctor. These include cold medicines, fever reducers, and cough suppressants.  Do not use any products that contain nicotine or tobacco. These include cigarettes and e-cigarettes. If you need help quitting, ask your doctor.  Avoid being where people are smoking (avoid secondhand smoke).  Make sure you get regular shots and get the flu shot every year.  Keep all follow-up visits as told by your doctor. This is important. How to avoid spreading infection to others   Wash your hands often with soap and water. If you do not have soap and water, use hand sanitizer.  Avoid touching your mouth, face, eyes, or nose.  Cough or  sneeze into a tissue or your sleeve or elbow. Do not cough or sneeze into your hand or into the air. Contact a doctor if:  You are getting worse, not better.  You have any of these: ? A fever. ? Chills. ? Brown or red mucus in your nose. ? Yellow or brown fluid (discharge)coming from your nose. ? Pain in your face, especially when you bend forward. ? Swollen neck glands. ? Pain with swallowing. ? White areas in the back of your throat. Get help right away if:  You have shortness of breath that gets worse.  You have very bad or constant: ? Headache. ? Ear pain. ? Pain in your forehead, behind your eyes, and over your cheekbones (sinus pain). ? Chest pain.  You have long-lasting (chronic) lung disease along with any of these: ? Wheezing. ? Long-lasting  cough. ? Coughing up blood. ? A change in your usual mucus.  You have a stiff neck.  You have changes in your: ? Vision. ? Hearing. ? Thinking. ? Mood. Summary  An upper respiratory infection (URI) is caused by a germ called a virus. The most common type of URI is often called "the common cold."  URIs usually get better within 7-10 days.  Take over-the-counter and prescription medicines only as told by your doctor. This information is not intended to replace advice given to you by your health care provider. Make sure you discuss any questions you have with your health care provider. Document Released: 05/27/2008 Document Revised: 08/01/2017 Document Reviewed: 08/01/2017 Elsevier Interactive Patient Education  2019 [employer].

## 2019-02-18 NOTE — Progress Notes (Signed)
Patient stated he started coughing a week ago.   Patient stated his right ear hurts when he cough.

## 2019-02-18 NOTE — Progress Notes (Signed)
Patient ID: Jay Wright, male   DOB: 1976/05/09, 43 y.o.   MRN: [id number]   [facility], is a 43 y.o. male  QMG:[id number]  [id number]  DOB - October 15, 1976  Subjective:  Chief Complaint and HPI: Jay Wright is a 43 y.o. male here today with 1 week h/o cough, congestion, and R ear pain.  Mucus is thick and green.  No f/c.  No OTC.  No GI s/sx.  Appetite is good.     ROS:   Constitutional:  No f/c, No night sweats, No unexplained weight loss. EENT:  No vision changes, No blurry vision, No hearing changes. Respiratory: + cough, No SOB Cardiac: No CP, no palpitations GI:  No abd pain, No N/V/D. GU: No Urinary s/sx Musculoskeletal: No joint pain Neuro: No headache, no dizziness, no motor weakness.  Skin: No rash Endocrine:  No polydipsia. No polyuria.  Psych: Denies SI/HI  No problems updated.  ALLERGIES: No Known Allergies  PAST MEDICAL HISTORY: History reviewed. No pertinent past medical history.  MEDICATIONS AT HOME: Prior to Admission medications   Medication Sig Start Date End Date Taking? Authorizing Provider  amitriptyline (ELAVIL) 10 MG tablet Take 1 tablet (10 mg total) by mouth at bedtime. Patient not taking: Reported on 02/18/2019 01/01/19   Tarry Kos, MD  azithromycin [facility]) 250 MG tablet Take 2 today then 1 daily 02/18/19   Anders Simmonds, PA-C  diclofenac sodium (VOLTAREN) 1 % GEL Apply 2 g topically 4 (four) times daily. Patient not taking: Reported on 12/03/2018 08/21/18   Cristie Hem, PA-C  fluticasone [facility]) 50 MCG/ACT nasal spray Place 2 sprays into both nostrils daily. 02/18/19   Anders Simmonds, PA-C  methocarbamol (ROBAXIN) 500 MG tablet Take 2 tablets (1,000 mg total) by mouth 3 (three) times daily. X 10 days then prn pain Patient not taking: Reported on 02/18/2019 01/21/19   Anders Simmonds, PA-C  naproxen (NAPROSYN) 500 MG tablet Take 1 tablet (500 mg total) by mouth 2 (two) times daily with a meal. X 10 days  then prn pain Patient not taking: Reported on 02/18/2019 01/21/19   Anders Simmonds, PA-C     Objective:  EXAM:   Vitals:   02/18/19 0839  BP: 112/75  Pulse: 67  Resp: 16  Temp: 98.2 F (36.8 C)  TempSrc: Oral  SpO2: 100%  Weight: 151 lb 3.2 oz (68.6 kg)  Height: 5\' 8"  (1.727 m)    General appearance : A&OX3. NAD. Non-toxic-appearing HEENT: Atraumatic and Normocephalic.  PERRLA. EOM intact.  R TM retracted.  L TM WNL. Mouth-MMM, post pharynx WNL w/o erythema, + PND. Neck: supple, no JVD. No cervical lymphadenopathy. No thyromegaly Chest/Lungs:  Breathing-non-labored, Good air entry bilaterally, breath sounds normal without rales, rhonchi, or wheezing  CVS: S1 S2 regular, no murmurs, gallops, rubs  Extremities: Bilateral Lower Ext shows no edema, both legs are warm to touch with = pulse throughout Neurology:  CN II-XII grossly intact, Non focal.   Psych:  TP linear. J/I WNL. Normal speech. Appropriate eye contact and affect.  Skin:  No Rash  Data Review No results found for: HGBA1C   Assessment & Plan   1. Upper respiratory tract infection, unspecified type Cover for atypicals, fluids, rest respiratory care - azithromycin (ZITHROMAX) 250 MG tablet; Take 2 today then 1 daily  Dispense: 6 tablet; Refill: 0  2. Eustachian tube dysfunction, right - fluticasone (FLONASE) 50 MCG/ACT nasal spray; Place 2 sprays into both nostrils daily.  Dispense: 16 g; Refill: 6     Patient have been counseled extensively about nutrition and exercise  Return if symptoms worsen or fail to improve.  The patient was given clear instructions to go to ER or return to medical center if symptoms don't improve, worsen or new problems develop. The patient verbalized understanding. The patient was told to call to get lab results if they haven't heard anything in the next week.     Georgian Co, PA-C [facility] [place], [place] [phone]   02/18/2019,  8:48 AM

## 2019-04-12 ENCOUNTER — Telehealth: Payer: Self-pay | Admitting: Nurse Practitioner

## 2019-04-12 NOTE — Telephone Encounter (Signed)
Patient dropped off apartment  Flooring update paperwork for PCP to fill out.  Paperwork will be dropped off in PCP box Please follow up.

## 2019-04-12 NOTE — Telephone Encounter (Signed)
Will inform PCP and will call patient when the form is ready.

## 2019-06-01 ENCOUNTER — Ambulatory Visit: Payer: Self-pay | Attending: Primary Care | Admitting: Primary Care

## 2019-06-01 ENCOUNTER — Encounter: Payer: Self-pay | Admitting: Primary Care

## 2019-06-01 ENCOUNTER — Other Ambulatory Visit: Payer: Self-pay

## 2019-06-01 DIAGNOSIS — L259 Unspecified contact dermatitis, unspecified cause: Secondary | ICD-10-CM

## 2019-06-01 MED ORDER — EUCERIN EX CREA: TOPICAL_CREAM | CUTANEOUS | 0 refills | Status: DC | PRN

## 2019-06-01 MED ORDER — HYDROXYZINE HCL 10 MG PO TABS: 10.0000 mg | ORAL_TABLET | Freq: Three times a day (TID) | ORAL | 0 refills | Status: DC | PRN

## 2019-06-01 NOTE — Progress Notes (Signed)
Virtual Visit via Telephone Note  I connected with Jay Wright on 06/01/19 at  1:50 PM EDT by telephone and verified that I am speaking with the correct person using two identifiers.   I discussed the limitations, risks, security and privacy concerns of performing an evaluation and management service by telephone and the availability of in person appointments. I also discussed with the patient that there may be a patient responsible charge related to this service. The patient expressed understanding and agreed to proceed.   History of Present Illness: [name] is having a telemetry visit due to puritisis under his arms at his has changed the color of skin to white.  He denies any changes in soaps, deodorants, foods, or laundry detergent.  No other concerns or complaints.   Observations/Objective: Review of Systems  Constitutional: Negative.   HENT: Negative.   Eyes: Negative.   Respiratory: Negative.   Cardiovascular: Negative.   Gastrointestinal: Negative.   Genitourinary: Negative.   Musculoskeletal: Negative.   Skin: Positive for itching and rash.  Neurological: Negative.   Endo/Heme/Allergies: Negative.   Psychiatric/Behavioral: Negative.     Assessment and Plan: [name] was seen today for pruritis.  Diagnoses and all orders for this visit:  Contact dermatitis and eczema With inability to actually see rash will treat empirically with medications listed below.  Advise if able to take a picture of areas to best diagnose and treat.  Patient in agreement.  Other orders -     hydrOXYzine (ATARAX/VISTARIL) 10 MG tablet; Take 1 tablet (10 mg total) by mouth 3 (three) times daily as needed. -     Skin Protectants, Misc. (EUCERIN) cream; Apply topically as needed for dry skin.    Follow Up Instructions:    I discussed the assessment and treatment plan with the patient. The patient was provided an opportunity to ask questions and all were answered. The patient agreed  with the plan and demonstrated an understanding of the instructions.   The patient was advised to call back or seek an in-person evaluation if the symptoms worsen or if the condition fails to improve as anticipated.  I provided 10 minutes of non-face-to-face time during this encounter.   Kerin Perna, NP

## 2019-06-01 NOTE — Progress Notes (Signed)
Pt states that when he scratches under his arm the skin turns white in patches

## 2019-12-28 DIAGNOSIS — M25561 Pain in right knee: Secondary | ICD-10-CM | POA: Diagnosis not present

## 2019-12-31 DIAGNOSIS — M25561 Pain in right knee: Secondary | ICD-10-CM | POA: Diagnosis not present

## 2020-01-14 DIAGNOSIS — M25561 Pain in right knee: Secondary | ICD-10-CM | POA: Diagnosis not present

## 2020-01-17 DIAGNOSIS — M25561 Pain in right knee: Secondary | ICD-10-CM | POA: Diagnosis not present

## 2020-01-18 DIAGNOSIS — M25561 Pain in right knee: Secondary | ICD-10-CM | POA: Diagnosis not present

## 2020-01-18 DIAGNOSIS — Z0000 Encounter for general adult medical examination without abnormal findings: Secondary | ICD-10-CM | POA: Diagnosis not present

## 2020-01-18 DIAGNOSIS — Z0001 Encounter for general adult medical examination with abnormal findings: Secondary | ICD-10-CM | POA: Diagnosis not present

## 2020-01-26 DIAGNOSIS — M25561 Pain in right knee: Secondary | ICD-10-CM | POA: Diagnosis not present

## 2020-02-03 DIAGNOSIS — H26102 Unspecified traumatic cataract, left eye: Secondary | ICD-10-CM | POA: Diagnosis not present

## 2020-02-03 DIAGNOSIS — M25561 Pain in right knee: Secondary | ICD-10-CM | POA: Diagnosis not present

## 2020-02-03 DIAGNOSIS — Z01818 Encounter for other preprocedural examination: Secondary | ICD-10-CM | POA: Diagnosis not present

## 2020-02-10 DIAGNOSIS — M25561 Pain in right knee: Secondary | ICD-10-CM | POA: Diagnosis not present

## 2020-02-17 DIAGNOSIS — M25561 Pain in right knee: Secondary | ICD-10-CM | POA: Diagnosis not present

## 2020-02-18 DIAGNOSIS — H26102 Unspecified traumatic cataract, left eye: Secondary | ICD-10-CM | POA: Diagnosis not present

## 2020-02-18 DIAGNOSIS — H25812 Combined forms of age-related cataract, left eye: Secondary | ICD-10-CM | POA: Diagnosis not present

## 2020-02-18 DIAGNOSIS — H2512 Age-related nuclear cataract, left eye: Secondary | ICD-10-CM | POA: Diagnosis not present

## 2020-03-23 DIAGNOSIS — H5213 Myopia, bilateral: Secondary | ICD-10-CM | POA: Diagnosis not present

## 2020-04-12 DIAGNOSIS — H52223 Regular astigmatism, bilateral: Secondary | ICD-10-CM | POA: Diagnosis not present

## 2020-05-09 ENCOUNTER — Ambulatory Visit
Admission: RE | Admit: 2020-05-09 | Discharge: 2020-05-09 | Disposition: A | Discharge status: Home / Self Care | Payer: Medicaid Other | Source: Ambulatory Visit | Attending: Family Medicine | Admitting: Family Medicine

## 2020-05-09 ENCOUNTER — Other Ambulatory Visit: Payer: Self-pay | Admitting: Family Medicine

## 2020-05-09 ENCOUNTER — Other Ambulatory Visit: Payer: Self-pay

## 2020-05-09 DIAGNOSIS — M79642 Pain in left hand: Secondary | ICD-10-CM | POA: Diagnosis not present

## 2020-05-09 DIAGNOSIS — M7989 Other specified soft tissue disorders: Secondary | ICD-10-CM | POA: Diagnosis not present

## 2020-05-09 DIAGNOSIS — T1490XA Injury, unspecified, initial encounter: Secondary | ICD-10-CM

## 2020-05-09 DIAGNOSIS — S6992XA Unspecified injury of left wrist, hand and finger(s), initial encounter: Secondary | ICD-10-CM | POA: Diagnosis not present

## 2020-05-29 DIAGNOSIS — L249 Irritant contact dermatitis, unspecified cause: Secondary | ICD-10-CM | POA: Diagnosis not present

## 2020-06-07 DIAGNOSIS — M79642 Pain in left hand: Secondary | ICD-10-CM | POA: Diagnosis not present

## 2020-06-10 DIAGNOSIS — Z20822 Contact with and (suspected) exposure to covid-19: Secondary | ICD-10-CM | POA: Diagnosis not present

## 2020-08-23 DIAGNOSIS — M79642 Pain in left hand: Secondary | ICD-10-CM | POA: Diagnosis not present

## 2020-08-23 DIAGNOSIS — M79645 Pain in left finger(s): Secondary | ICD-10-CM | POA: Diagnosis not present

## 2020-09-01 DIAGNOSIS — M79642 Pain in left hand: Secondary | ICD-10-CM | POA: Diagnosis not present

## 2020-09-15 DIAGNOSIS — M79642 Pain in left hand: Secondary | ICD-10-CM | POA: Diagnosis not present

## 2020-09-29 DIAGNOSIS — M79642 Pain in left hand: Secondary | ICD-10-CM | POA: Diagnosis not present

## 2020-10-13 DIAGNOSIS — M79642 Pain in left hand: Secondary | ICD-10-CM | POA: Diagnosis not present

## 2020-11-03 DIAGNOSIS — M79642 Pain in left hand: Secondary | ICD-10-CM | POA: Diagnosis not present

## 2020-11-10 DIAGNOSIS — M79642 Pain in left hand: Secondary | ICD-10-CM | POA: Diagnosis not present

## 2020-11-24 DIAGNOSIS — M79642 Pain in left hand: Secondary | ICD-10-CM | POA: Diagnosis not present

## 2020-12-01 DIAGNOSIS — M79645 Pain in left finger(s): Secondary | ICD-10-CM | POA: Diagnosis not present

## 2020-12-01 DIAGNOSIS — M79642 Pain in left hand: Secondary | ICD-10-CM | POA: Diagnosis not present

## 2020-12-14 DIAGNOSIS — M79642 Pain in left hand: Secondary | ICD-10-CM | POA: Diagnosis not present

## 2021-03-20 ENCOUNTER — Ambulatory Visit
Admission: RE | Admit: 2021-03-20 | Discharge: 2021-03-20 | Disposition: A | Discharge status: Home / Self Care | Payer: Self-pay | Source: Ambulatory Visit | Attending: Family Medicine | Admitting: Family Medicine

## 2021-03-20 ENCOUNTER — Other Ambulatory Visit: Payer: Self-pay

## 2021-03-20 ENCOUNTER — Other Ambulatory Visit: Payer: Self-pay | Admitting: Family Medicine

## 2021-03-20 DIAGNOSIS — M5412 Radiculopathy, cervical region: Secondary | ICD-10-CM

## 2021-03-21 ENCOUNTER — Encounter: Payer: Self-pay | Admitting: Primary Care

## 2021-06-29 ENCOUNTER — Ambulatory Visit
Admission: RE | Admit: 2021-06-29 | Discharge: 2021-06-29 | Disposition: A | Discharge status: Home / Self Care | Payer: Medicaid Other | Source: Ambulatory Visit | Attending: Family Medicine | Admitting: Family Medicine

## 2021-06-29 ENCOUNTER — Other Ambulatory Visit: Payer: Self-pay

## 2021-06-29 ENCOUNTER — Other Ambulatory Visit: Payer: Self-pay | Admitting: Family Medicine

## 2021-06-29 DIAGNOSIS — M5410 Radiculopathy, site unspecified: Secondary | ICD-10-CM

## 2021-06-29 DIAGNOSIS — T1490XA Injury, unspecified, initial encounter: Secondary | ICD-10-CM

## 2021-08-11 ENCOUNTER — Other Ambulatory Visit: Payer: Self-pay

## 2021-08-11 ENCOUNTER — Emergency Department (HOSPITAL_COMMUNITY)
Admission: EM | Admit: 2021-08-11 | Discharge: 2021-08-12 | Disposition: A | Discharge status: Home / Self Care | Payer: Medicaid Other | Attending: Emergency Medicine | Admitting: Emergency Medicine

## 2021-08-11 DIAGNOSIS — W500XXA Accidental hit or strike by another person, initial encounter: Secondary | ICD-10-CM | POA: Insufficient documentation

## 2021-08-11 DIAGNOSIS — S01111A Laceration without foreign body of right eyelid and periocular area, initial encounter: Secondary | ICD-10-CM | POA: Insufficient documentation

## 2021-08-11 DIAGNOSIS — S0181XA Laceration without foreign body of other part of head, initial encounter: Secondary | ICD-10-CM

## 2021-08-11 DIAGNOSIS — Y9366 Activity, soccer: Secondary | ICD-10-CM | POA: Insufficient documentation

## 2021-08-11 DIAGNOSIS — S0591XA Unspecified injury of right eye and orbit, initial encounter: Secondary | ICD-10-CM | POA: Diagnosis present

## 2021-08-11 NOTE — ED Triage Notes (Addendum)
Pt reports playing soccer tonight and bumped heads with another player.  Pt has a vertical laceration to right eyebrow. Bleeding controlled. Pt denies loc, dizziness, pain, or headache.

## 2021-08-12 MED ORDER — LIDOCAINE-EPINEPHRINE-TETRACAINE (LET) TOPICAL GEL
3.0000 mL | Freq: Once | TOPICAL | Status: AC
  Administered 2021-08-12: 3 mL via TOPICAL
  Filled 2021-08-12: qty 3

## 2021-08-12 MED ORDER — ONDANSETRON HCL 4 MG/2ML IJ SOLN
INTRAMUSCULAR | Status: AC
  Administered 2021-08-12: 4 mg via INTRAMUSCULAR
  Filled 2021-08-12: qty 2

## 2021-08-12 NOTE — ED Notes (Signed)
Pt has a ~1in laceration to inner aspect of right eyebrow, bleeding currently controlled.

## 2021-08-12 NOTE — ED Notes (Signed)
EDP at bedside  

## 2021-08-12 NOTE — Discharge Instructions (Addendum)
See your doctor in 4-5 days to have sutures removed. Go sooner if there are any signs of infection - increasing pain, swelling or redness, or drainage from the wound. Follow the provided wound care instructions.   Tylenol and/or ibuprofen for pain

## 2021-08-12 NOTE — ED Provider Notes (Signed)
[facility]-EMERGENCY DEPT Provider Note   CSN: [id number] Arrival date & time: 08/11/21  2327     History Chief Complaint  Patient presents with   Laceration    Jay Wright is a 45 y.o. male.  Patient to ED after colliding with another player while playing soccer sustaining a laceration to the right eyebrow. No LOC, nausea, vomiting. No other injury. No eye pain or visual change. No neck pain.  The history is provided by the patient. No language interpreter was used.  Laceration     No past medical history on file.  Patient Active Problem List   Diagnosis Date Noted   Right hand pain 08/21/2018   Shoulder pain 06/24/2012    Past Surgical History:  Procedure Laterality Date   right shoulder surgery         Family History  Problem Relation Age of Onset   Healthy Mother    Healthy Father     Social History   Tobacco Use   Smoking status: Never   Smokeless tobacco: Never  Vaping Use   Vaping Use: Never used  Substance Use Topics   Alcohol use: Never   Drug use: Never    Home Medications Prior to Admission medications   Medication Sig Start Date End Date Taking? Authorizing Provider  amitriptyline (ELAVIL) 10 MG tablet Take 1 tablet (10 mg total) by mouth at bedtime. Patient not taking: Reported on 02/18/2019 01/01/19   Tarry Kos, MD  azithromycin [facility]) 250 MG tablet Take 2 today then 1 daily Patient not taking: Reported on 06/01/2019 02/18/19   Anders Simmonds, PA-C  fluticasone [facility]) 50 MCG/ACT nasal spray Place 2 sprays into both nostrils daily. Patient not taking: Reported on 06/01/2019 02/18/19   Anders Simmonds, PA-C  hydrOXYzine (ATARAX/VISTARIL) 10 MG tablet Take 1 tablet (10 mg total) by mouth 3 (three) times daily as needed. 06/01/19   Grayce Sessions, NP  Skin Protectants, Misc. (EUCERIN) cream Apply topically as needed for dry skin. 06/01/19   Grayce Sessions, NP    Allergies    Patient has  no known allergies.  Review of Systems   Review of Systems  Constitutional:  Negative for diaphoresis.  HENT:  Positive for facial swelling.   Eyes:  Negative for pain and visual disturbance.  Respiratory:  Negative for shortness of breath.   Gastrointestinal:  Negative for nausea.  Musculoskeletal:  Negative for back pain and neck pain.  Skin:  Positive for wound.  Neurological:  Negative for syncope.   Physical Exam Updated Vital Signs BP 102/74 (BP Location: Right Arm)   Pulse (!) 52   Temp 98.4 F (36.9 C) (Oral)   Resp 17   Ht 5\' 8"  (1.727 m)   Wt 68.5 kg   SpO2 98%   BMI 22.96 kg/m   Physical Exam Vitals and nursing note reviewed.  Constitutional:      Appearance: Normal appearance.  HENT:     Head: Normocephalic.     Comments: No facial bone tenderness.     Nose: Nose normal.     Mouth/Throat:     Mouth: Mucous membranes are moist.  Eyes:     Extraocular Movements: Extraocular movements intact.     Conjunctiva/sclera: Conjunctivae normal.     Pupils: Pupils are equal, round, and reactive to light.  Cardiovascular:     Rate and Rhythm: Normal rate.  Pulmonary:     Effort: Pulmonary effort is normal.  Abdominal:     Tenderness: There is no abdominal tenderness.  Musculoskeletal:        General: Normal range of motion.     Cervical back: Normal range of motion and neck supple.     Comments: No midline cervical tenderness. Full, painfree ROM neck.   Skin:    General: Skin is warm and dry.     Comments: 3 cm linear, full thickness laceration to right eyebrow with associated localized swelling.   Neurological:     General: No focal deficit present.     Mental Status: He is alert and oriented to person, place, and time.     Coordination: Coordination normal.     Gait: Gait normal.    ED Results / Procedures / Treatments   Labs (all labs ordered are listed, but only abnormal results are displayed) Labs Reviewed - No data to  display  EKG None  Radiology No results found.  Procedures .Marland KitchenLaceration Repair  Date/Time: 08/12/2021 3:38 AM Performed by: Elpidio Anis, PA-C Authorized by: Elpidio Anis, PA-C   Consent:    Consent obtained:  Verbal   Consent given by:  Patient Universal protocol:    Procedure explained and questions answered to patient or proxy's satisfaction: yes     Immediately prior to procedure, a time out was called: yes     Patient identity confirmed:  Verbally with patient Anesthesia:    Anesthesia method:  Local infiltration and topical application   Topical anesthetic:  LET   Local anesthetic:  Lidocaine 2% WITH epi Laceration details:    Location:  Face   Face location:  R eyebrow   Length (cm):  3 Treatment:    Area cleansed with:  Povidone-iodine and saline   Amount of cleaning:  Standard Skin repair:    Repair method:  Sutures   Suture size:  6-0   Suture material:  Prolene   Suture technique:  Running   Number of sutures:  8 Approximation:    Approximation:  Close Repair type:    Repair type:  Simple Comments:     Patient experienced a vasovagal episode after injecting with lidocaine that improved/resolved as expected. L.E.T. applied for additional anesthesia to avoid additional injection and gave adequate pain control during lac repair.   Medications Ordered in ED Medications  lidocaine-EPINEPHrine-tetracaine (LET) topical gel (has no administration in time range)  ondansetron (ZOFRAN) 4 MG/2ML injection (4 mg Intramuscular Given 08/12/21 0241)    ED Course  I have reviewed the triage vital signs and the nursing notes.  Pertinent labs & imaging results that were available during my care of the patient were reviewed by me and considered in my medical decision making (see chart for details).    MDM Rules/Calculators/A&P                           Patient to ED with injury while playing soccer, limited to facial laceration after colliding with another  player.   During laceration repair, the patient became diaphoretic, nauseous, dizzy and hypotensive c/w vasovagal episode. Symptoms resolved after several minutes as expected. Laceration was repaired as per above note.     Final Clinical Impression(s) / ED Diagnoses Final diagnoses:  None   Facial laceration  Rx / DC Orders ED Discharge Orders     None        Elpidio Anis, PA-C 08/12/21 [id number]    LongArlyss Repress, MD 08/18/21 [phone]

## 2021-08-13 ENCOUNTER — Telehealth: Payer: Self-pay

## 2021-08-13 NOTE — Telephone Encounter (Signed)
Transition Care Management Unsuccessful Follow-up Telephone Call  Date of discharge and from where:  08/12/2021-[facility]   Attempts:  1st Attempt  Reason for unsuccessful TCM follow-up call:  Left voice message    

## 2021-08-14 NOTE — Telephone Encounter (Signed)
Transition Care Management Follow-up Telephone Call Date of discharge and from where: 08/12/2021 from [facility] How have you been since you were released from the hospital? Pt stated that the laceration is feeling okay and he did not have any questions or concerns at this time.  Any questions or concerns? No  Items Reviewed: Did the pt receive and understand the discharge instructions provided? Yes  Medications obtained and verified? Yes  Other? No  Any new allergies since your discharge? No  Dietary orders reviewed? No Do you have support at home? Yes   Functional Questionnaire: (I = Independent and D = Dependent) ADLs: I  Bathing/Dressing- I  Meal Prep- I  Eating- I  Maintaining continence- I  Transferring/Ambulation- I  Managing Meds- I   Follow up appointments reviewed:  PCP Hospital f/u appt confirmed? No   Specialist Hospital f/u appt confirmed? No   Are transportation arrangements needed? No  If their condition worsens, is the pt aware to call PCP or go to the Emergency Dept.? Yes Was the patient provided with contact information for the PCP's office or ED? Yes Was to pt encouraged to call back with questions or concerns? Yes

## 2021-12-05 IMAGING — CR DG HAND COMPLETE 3+V*L*
3 series · 3 of 3 positions shown · non-contrast
Comparison: None

CLINICAL DATA: Injury to LEFT hand [REDACTED], pain in third finger
with swelling

EXAM:
LEFT HAND - COMPLETE 3+ VIEW

[x hand pa left]
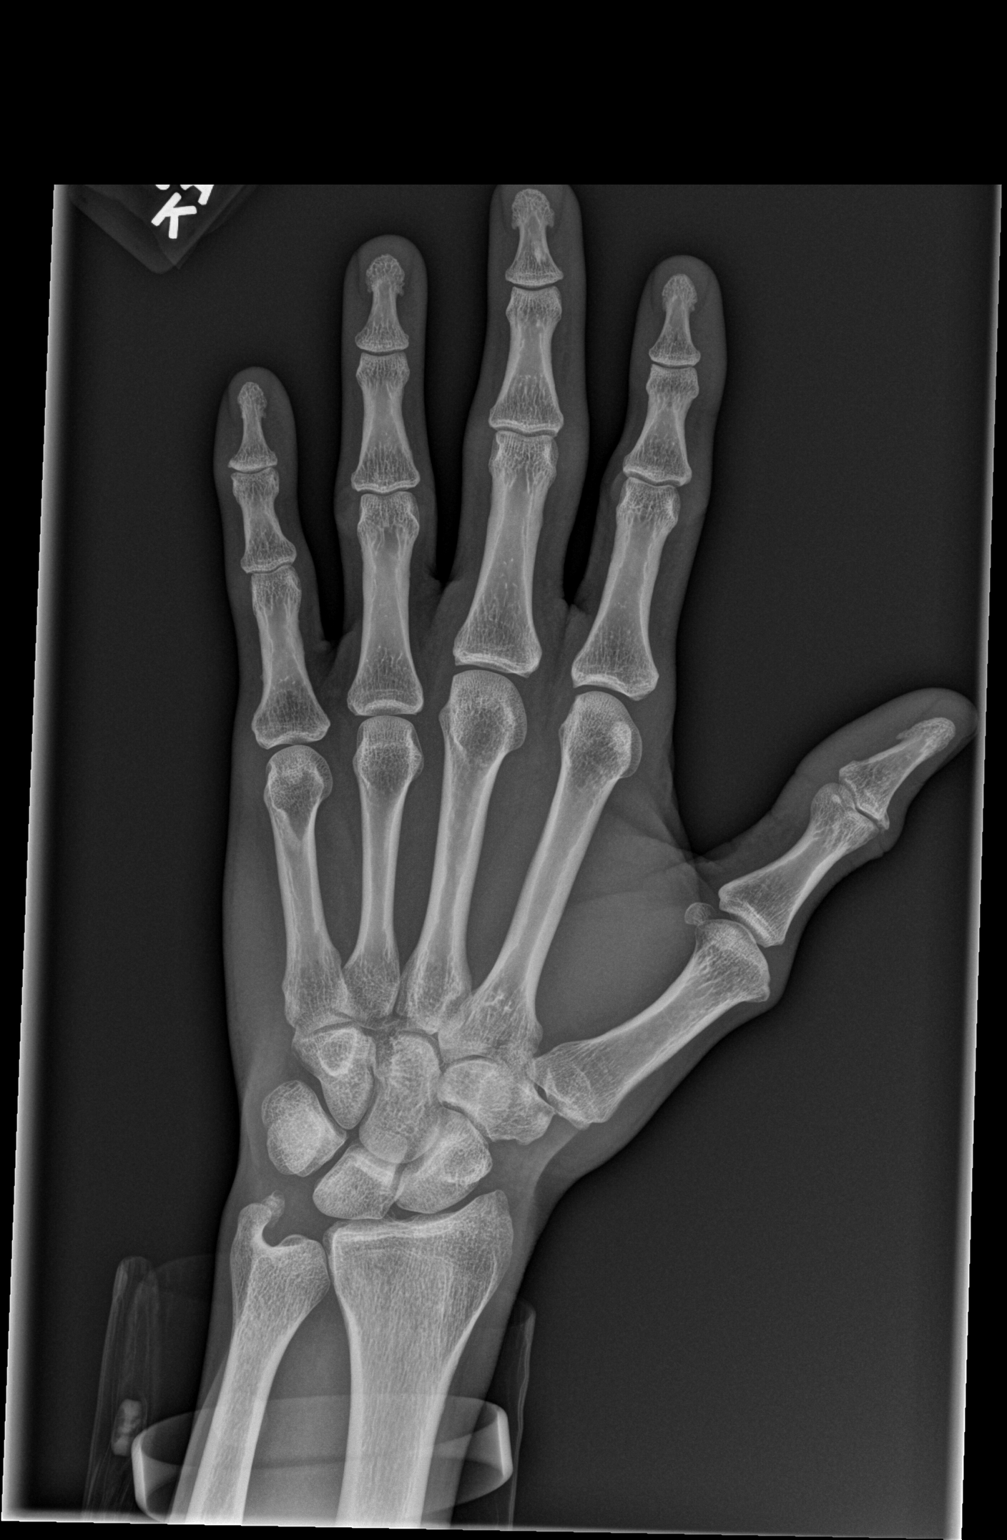

[x hand obl left]
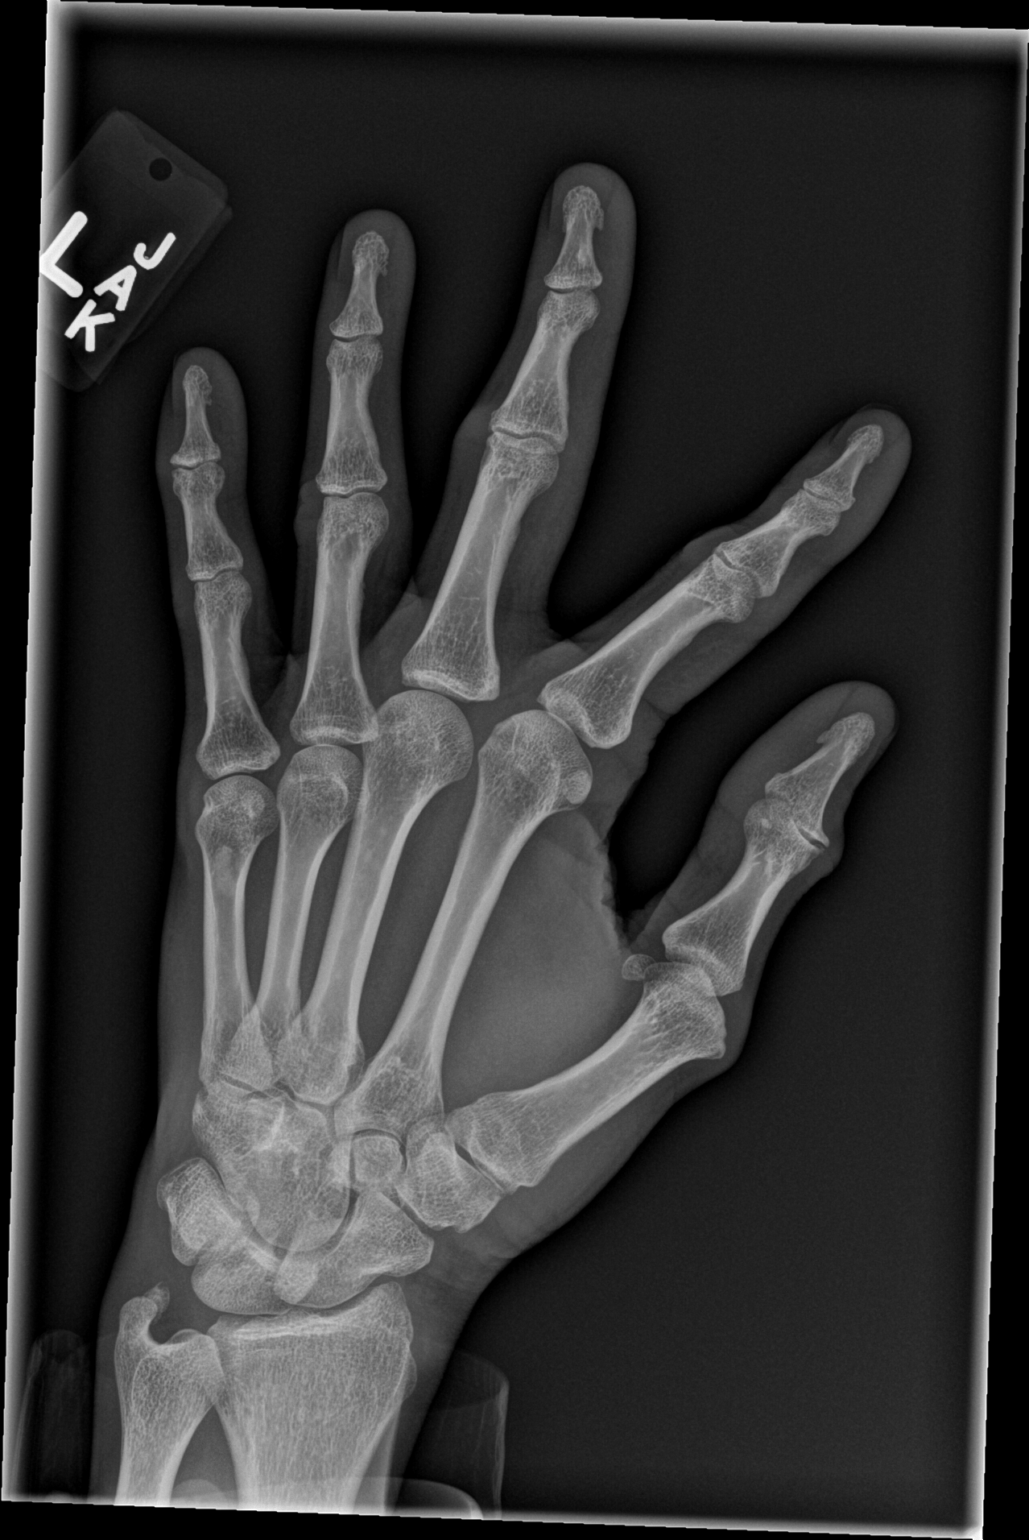

[x hand lat left]
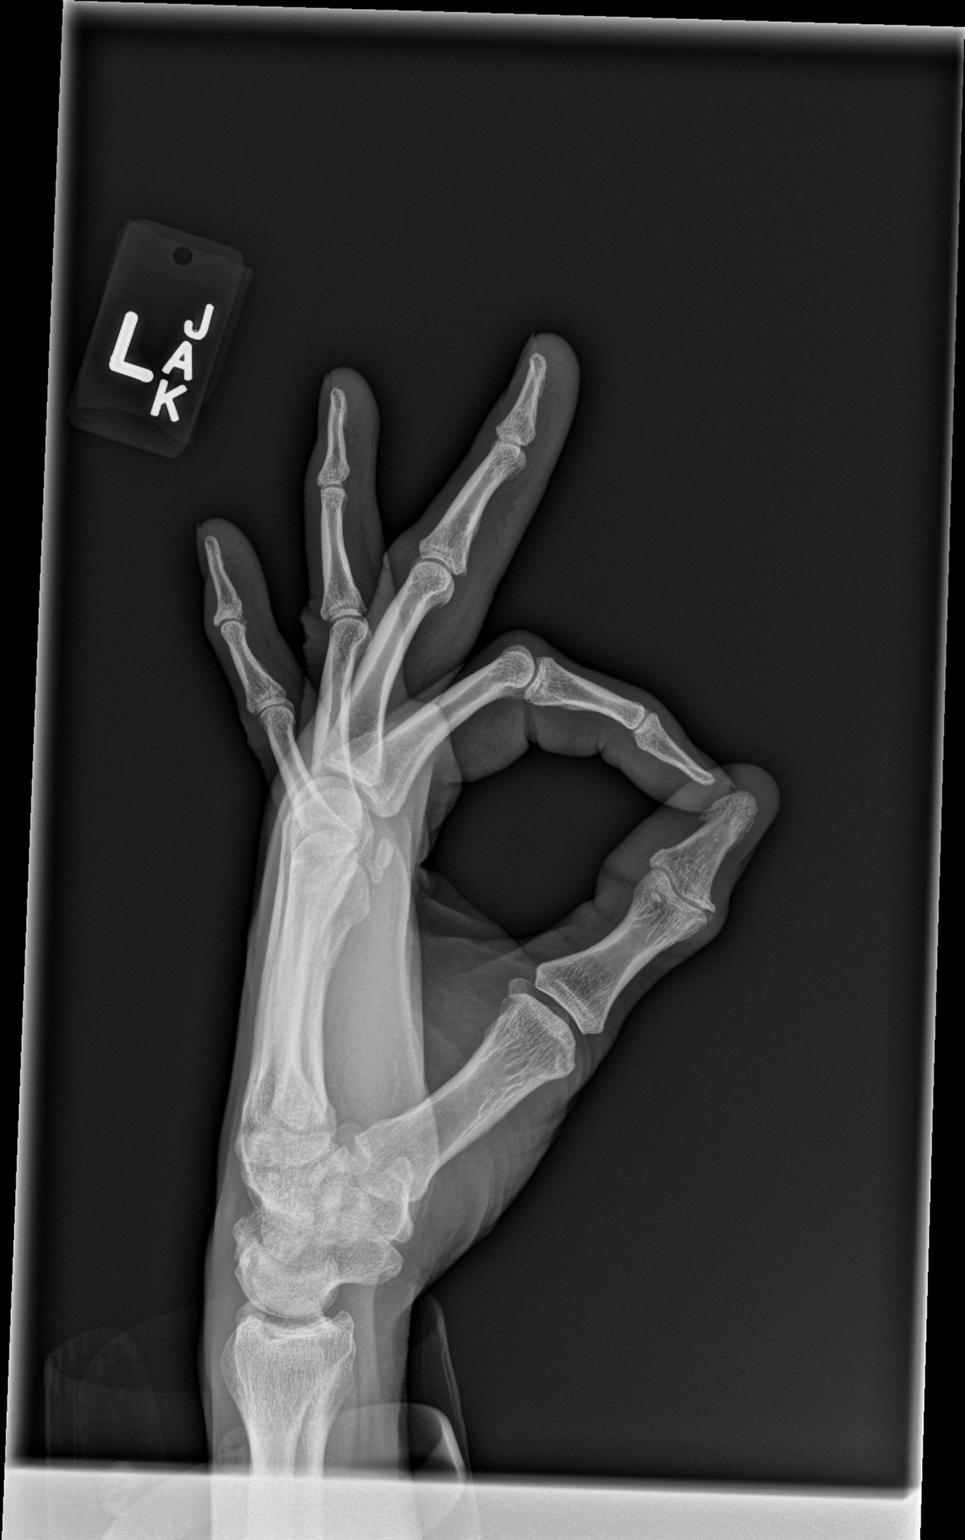

[3 of 3 positions shown; findings below may reference images not displayed]

FINDINGS: Soft tissue swelling LEFT middle finger centered at PIP joint.

Osseous mineralization normal.

Joint spaces preserved.

Small bone island distal phalanx middle finger.

Non fused ossicle at ulnar styloid.

No acute fracture, dislocation, or bone destruction.

Degenerative changes at IP joint thumb.
IMPRESSION: No acute osseous abnormalities.

## 2022-05-09 ENCOUNTER — Other Ambulatory Visit: Payer: Self-pay | Admitting: Family Medicine

## 2022-05-09 DIAGNOSIS — R319 Hematuria, unspecified: Secondary | ICD-10-CM

## 2022-05-13 ENCOUNTER — Ambulatory Visit
Admission: RE | Admit: 2022-05-13 | Discharge: 2022-05-13 | Disposition: A | Discharge status: Home / Self Care | Payer: Medicaid Other | Source: Ambulatory Visit | Attending: Family Medicine | Admitting: Family Medicine

## 2022-05-13 DIAGNOSIS — R319 Hematuria, unspecified: Secondary | ICD-10-CM

## 2022-11-22 ENCOUNTER — Ambulatory Visit
Admission: EM | Admit: 2022-11-22 | Discharge: 2022-11-22 | Disposition: A | Discharge status: Home / Self Care | Payer: Medicaid Other | Attending: Emergency Medicine | Admitting: Emergency Medicine

## 2022-11-22 DIAGNOSIS — H66001 Acute suppurative otitis media without spontaneous rupture of ear drum, right ear: Secondary | ICD-10-CM

## 2022-11-22 DIAGNOSIS — J309 Allergic rhinitis, unspecified: Secondary | ICD-10-CM | POA: Diagnosis not present

## 2022-11-22 MED ORDER — FLUTICASONE PROPIONATE 50 MCG/ACT NA SUSP: 1.0000 | Freq: Every day | NASAL | 1 refills | Status: AC

## 2022-11-22 MED ORDER — CEFDINIR 300 MG PO CAPS: 300.0000 mg | ORAL_CAPSULE | Freq: Two times a day (BID) | ORAL | 0 refills | Status: AC

## 2022-11-22 MED ORDER — CETIRIZINE HCL 10 MG PO TABS: 10.0000 mg | ORAL_TABLET | Freq: Every day | ORAL | 1 refills | Status: AC

## 2022-11-22 NOTE — Discharge Instructions (Signed)
Your symptoms and my physical exam findings are concerning for exacerbation of your underlying allergies.    To avoid catching frequent respiratory infections, having skin reactions, dealing with eye irritation, losing sleep, missing work, etc., due to uncontrolled allergies, it is important that you begin/continue your allergy regimen and are consistent with taking your meds exactly as prescribed.   Please see the list below for recommended medications, dosages and frequencies to provide relief of current symptoms:     Omnicef (cefdinir):  1 capsule twice daily for 10 days, you can take it with or without food.  This antibiotic can cause upset stomach, this will resolve once antibiotics are complete.  You are welcome to use a probiotic, eat yogurt, take Imodium while taking this medication.  Please avoid other systemic medications such as Maalox, Pepto-Bismol or milk of magnesia as they can interfere with your body's ability to absorb the antibiotics.  Zyrtec (cetirizine): This is an excellent second-generation antihistamine that helps to reduce respiratory inflammatory response to environmental allergens.  In some patients, this medication can cause daytime sleepiness so I recommend that you take 1 tablet daily at bedtime.     Flonase (fluticasone): This is a steroid nasal spray that you use once daily, 1 spray in each nare.  This medication does not work well if you decide to use it only used as you feel you need to, it works best used on a daily basis.  After 3 to 5 days of use, you will notice significant reduction of the inflammation and mucus production that is currently being caused by exposure to allergens, whether seasonal or environmental.  The most common side effect of this medication is nosebleeds.  If you experience a nosebleed, please discontinue use for 1 week, then feel free to resume.  I have provided you with a prescription.     If you find that you have not had significant relief of  your symptoms in the next 7 to 10 days, please follow-up with your primary care provider or return here to urgent care for repeat evaluation and further recommendations.   Thank you for visiting urgent care today.  We appreciate the opportunity to participate in your care.  Pataday (olopatadine) is the eyedrop that was prescribed for your son this morning.  It is available over-the-counter if it could not be filled by your pharmacist.

## 2022-11-22 NOTE — ED Triage Notes (Signed)
Pt reports right ear pain since this morning; sore throat x 3 days.

## 2022-11-22 NOTE — ED Provider Notes (Signed)
[facility]    CSN: [id number] Arrival date & time: 11/22/22  1836    HISTORY   Chief Complaint  Patient presents with   Otalgia        Sore Throat         HPI Jay Wright is a pleasant, 46 y.o. male who presents to urgent care today. Patient complains of pain in his right ear since this morning and sore throat for the past 3 days.  Patient has a history of allergic rhinitis, has been prescribed Flonase in the past, states no longer using.  The history is provided by the patient.   History reviewed. No pertinent past medical history. Patient Active Problem List   Diagnosis Date Noted   Right hand pain 08/21/2018   Shoulder pain 06/24/2012   Past Surgical History:  Procedure Laterality Date   right shoulder surgery      Home Medications    Prior to Admission medications   Not on File    Family History Family History  Problem Relation Age of Onset   Healthy Mother    Healthy Father    Social History Social History   Tobacco Use   Smoking status: Never   Smokeless tobacco: Never  Vaping Use   Vaping Use: Never used  Substance Use Topics   Alcohol use: Never   Drug use: Never   Allergies   Patient has no known allergies.  Review of Systems Review of Systems Pertinent findings revealed after performing a 14 point review of systems has been noted in the history of present illness.  Physical Exam Triage Vital Signs ED Triage Vitals  Enc Vitals Group     BP 10/19/21 0827 (!) 147/82     Pulse Rate 10/19/21 0827 72     Resp 10/19/21 0827 18     Temp 10/19/21 0827 98.3 F (36.8 C)     Temp Source 10/19/21 0827 Oral     SpO2 10/19/21 0827 98 %     Weight --      Height --      Head Circumference --      Peak Flow --      Pain Score 10/19/21 0826 5     Pain Loc --      Pain Edu? --      Excl. in GC? --   No data found.  Updated Vital Signs BP 113/77 (BP Location: Right Arm)   Pulse 68   Temp 98.5 F (36.9 C)  (Oral)   Resp 17   SpO2 97%   Physical Exam Vitals and nursing note reviewed.  Constitutional:      General: He is not in acute distress.    Appearance: Normal appearance. He is not ill-appearing.  HENT:     Head: Normocephalic and atraumatic.     Salivary Glands: Right salivary gland is not diffusely enlarged or tender. Left salivary gland is not diffusely enlarged or tender.     Right Ear: Hearing, ear canal and external ear normal. No drainage. A middle ear effusion is present. There is no impacted cerumen. Tympanic membrane is injected, erythematous and retracted. Tympanic membrane is not bulging.     Left Ear: Hearing, ear canal and external ear normal. No drainage.  No middle ear effusion. There is no impacted cerumen. Tympanic membrane is bulging. Tympanic membrane is not injected or erythematous.     Ears:     Comments: Bilateral EACs with mild erythema  Nose: Rhinorrhea present. No nasal deformity, septal deviation, signs of injury, nasal tenderness, mucosal edema or congestion. Rhinorrhea is clear.     Right Nostril: Occlusion present. No foreign body, epistaxis or septal hematoma.     Left Nostril: Occlusion present. No foreign body, epistaxis or septal hematoma.     Right Turbinates: Enlarged, swollen and pale.     Left Turbinates: Enlarged, swollen and pale.     Right Sinus: No maxillary sinus tenderness or frontal sinus tenderness.     Left Sinus: No maxillary sinus tenderness or frontal sinus tenderness.     Mouth/Throat:     Lips: Pink. No lesions.     Mouth: Mucous membranes are moist. No oral lesions.     Pharynx: Oropharynx is clear. Uvula midline. No pharyngeal swelling, oropharyngeal exudate, posterior oropharyngeal erythema or uvula swelling.     Tonsils: No tonsillar exudate. 0 on the right. 0 on the left.     Comments: ++Postnasal drip Eyes:     General: Lids are normal.        Right eye: No discharge.        Left eye: No discharge.     Extraocular  Movements: Extraocular movements intact.     Conjunctiva/sclera: Conjunctivae normal.     Right eye: Right conjunctiva is not injected.     Left eye: Left conjunctiva is not injected.  Neck:     Trachea: Trachea and phonation normal.  Cardiovascular:     Rate and Rhythm: Normal rate and regular rhythm.     Pulses: Normal pulses.     Heart sounds: Normal heart sounds. No murmur heard.    No friction rub. No gallop.  Pulmonary:     Effort: Pulmonary effort is normal. No accessory muscle usage, prolonged expiration or respiratory distress.     Breath sounds: Normal breath sounds. No stridor, decreased air movement or transmitted upper airway sounds. No decreased breath sounds, wheezing, rhonchi or rales.  Chest:     Chest wall: No tenderness.  Musculoskeletal:        General: Normal range of motion.     Cervical back: Full passive range of motion without pain, normal range of motion and neck supple. Normal range of motion.  Lymphadenopathy:     Cervical: No cervical adenopathy.  Skin:    General: Skin is warm and dry.     Findings: No erythema or rash.  Neurological:     General: No focal deficit present.     Mental Status: He is alert and oriented to person, place, and time.  Psychiatric:        Mood and Affect: Mood normal.        Behavior: Behavior normal.     Visual Acuity Right Eye Distance:   Left Eye Distance:   Bilateral Distance:    Right Eye Near:   Left Eye Near:    Bilateral Near:     UC Couse / Diagnostics / Procedures:     Radiology No results found.  Procedures Procedures (including critical care time) EKG  Pending results:  Labs Reviewed - No data to display  Medications Ordered in UC: Medications - No data to display  UC Diagnoses / Final Clinical Impressions(s)   I have reviewed the triage vital signs and the nursing notes.  Pertinent labs & imaging results that were available during my care of the patient were reviewed by me and considered  in my medical decision making (see chart for details).  Final diagnoses:  Acute suppurative otitis media of right ear without spontaneous rupture of tympanic membrane, recurrence not specified  Allergic rhinitis, unspecified seasonality, unspecified trigger   ***  ED Prescriptions     Medication Sig Dispense Auth. Provider   cefdinir (OMNICEF) 300 MG capsule Take 1 capsule (300 mg total) by mouth 2 (two) times daily for 10 days. 20 capsule Theadora Rama Scales, PA-C   cetirizine (ZYRTEC ALLERGY) 10 MG tablet Take 1 tablet (10 mg total) by mouth at bedtime. 90 tablet Theadora Rama Scales, PA-C   fluticasone (FLONASE) 50 MCG/ACT nasal spray Place 1 spray into both nostrils daily. 47.4 mL Theadora Rama Scales, PA-C      PDMP not reviewed this encounter.  Disposition Upon Discharge:  Condition: stable for discharge home Home: take medications as prescribed; routine discharge instructions as discussed; follow up as advised.  Patient presented with an acute illness with associated systemic symptoms and significant discomfort requiring urgent management. In my opinion, this is a condition that a prudent lay person (someone who possesses an average knowledge of health and medicine) may potentially expect to result in complications if not addressed urgently such as respiratory distress, impairment of bodily function or dysfunction of bodily organs.   Routine symptom specific, illness specific and/or disease specific instructions were discussed with the patient and/or caregiver at length.   As such, the patient has been evaluated and assessed, work-up was performed and treatment was provided in alignment with urgent care protocols and evidence based medicine.  Patient/parent/caregiver has been advised that the patient may require follow up for further testing and treatment if the symptoms continue in spite of treatment, as clinically indicated and appropriate.  If the patient was tested  for COVID-19, Influenza and/or RSV, then the patient/parent/guardian was advised to isolate at home pending the results of his/her diagnostic coronavirus test and potentially longer if they're positive. I have also advised pt that if his/her COVID-19 test returns positive, it's recommended to self-isolate for at least 10 days after symptoms first appeared AND until fever-free for 24 hours without fever reducer AND other symptoms have improved or resolved. Discussed self-isolation recommendations as well as instructions for household member/close contacts as per the [facility] and [place] [facility], and also gave patient the COVID packet with this information.  Patient/parent/caregiver has been advised to return to the [facility] or PCP in 3-5 days if no better; to PCP or the Emergency Department if new signs and symptoms develop, or if the current signs or symptoms continue to change or worsen for further workup, evaluation and treatment as clinically indicated and appropriate  The patient will follow up with their current PCP if and as advised. If the patient does not currently have a PCP we will assist them in obtaining one.   The patient may need specialty follow up if the symptoms continue, in spite of conservative treatment and management, for further workup, evaluation, consultation and treatment as clinically indicated and appropriate.  Patient/parent/caregiver verbalized understanding and agreement of plan as discussed.  All questions were addressed during visit.  Please see discharge instructions below for further details of plan.  Discharge Instructions:   Discharge Instructions      Your symptoms and my physical exam findings are concerning for exacerbation of your underlying allergies.    To avoid catching frequent respiratory infections, having skin reactions, dealing with eye irritation, losing sleep, missing work, etc., due to uncontrolled allergies, it is important that you begin/continue your allergy  regimen and are  consistent with taking your meds exactly as prescribed.   Please see the list below for recommended medications, dosages and frequencies to provide relief of current symptoms:     Omnicef (cefdinir):  1 capsule twice daily for 10 days, you can take it with or without food.  This antibiotic can cause upset stomach, this will resolve once antibiotics are complete.  You are welcome to use a probiotic, eat yogurt, take Imodium while taking this medication.  Please avoid other systemic medications such as Maalox, Pepto-Bismol or milk of magnesia as they can interfere with your body's ability to absorb the antibiotics.  Zyrtec (cetirizine): This is an excellent second-generation antihistamine that helps to reduce respiratory inflammatory response to environmental allergens.  In some patients, this medication can cause daytime sleepiness so I recommend that you take 1 tablet daily at bedtime.     Flonase (fluticasone): This is a steroid nasal spray that you use once daily, 1 spray in each nare.  This medication does not work well if you decide to use it only used as you feel you need to, it works best used on a daily basis.  After 3 to 5 days of use, you will notice significant reduction of the inflammation and mucus production that is currently being caused by exposure to allergens, whether seasonal or environmental.  The most common side effect of this medication is nosebleeds.  If you experience a nosebleed, please discontinue use for 1 week, then feel free to resume.  I have provided you with a prescription.     If you find that you have not had significant relief of your symptoms in the next 7 to 10 days, please follow-up with your primary care provider or return here to urgent care for repeat evaluation and further recommendations.   Thank you for visiting urgent care today.  We appreciate the opportunity to participate in your care.  Pataday (olopatadine) is the eyedrop that was  prescribed for your son this morning.  It is available over-the-counter if it could not be filled by your pharmacist.           This office note has been dictated using Dragon speech recognition software.  Unfortunately, this method of dictation can sometimes lead to typographical or grammatical errors.  I apologize for your inconvenience in advance if this occurs.  Please do not hesitate to reach out to me if clarification is needed.

## 2023-12-04 ENCOUNTER — Ambulatory Visit
Admission: EM | Admit: 2023-12-04 | Discharge: 2023-12-04 | Disposition: A | Discharge status: Home / Self Care | Payer: Medicaid Other | Attending: Internal Medicine | Admitting: Internal Medicine

## 2023-12-04 DIAGNOSIS — B349 Viral infection, unspecified: Secondary | ICD-10-CM | POA: Diagnosis not present

## 2023-12-04 DIAGNOSIS — R0789 Other chest pain: Secondary | ICD-10-CM

## 2023-12-04 MED ORDER — IBUPROFEN 600 MG PO TABS: 600.0000 mg | ORAL_TABLET | Freq: Four times a day (QID) | ORAL | 0 refills | Status: AC | PRN

## 2023-12-04 MED ORDER — CYCLOBENZAPRINE HCL 5 MG PO TABS: 5.0000 mg | ORAL_TABLET | Freq: Every evening | ORAL | 0 refills | Status: AC | PRN

## 2023-12-04 NOTE — ED Provider Notes (Signed)
[facility]  Note:  This document was prepared using Conservation officer, historic buildings and may include unintentional dictation errors.  MRN: [id number] DOB: Jul 16, 1976  Subjective:   Jay Wright is a 47 y.o. male presenting for 2-day history of intermittent transient midsternal chest pain that elicits chest tightness and shortness of breath.  Lasts a few minutes and resolved on its own.  Reports that his symptoms were improved by stretching.  No fever, runny or stuffy nose, sore throat, cough, wheezing, nausea, vomiting, abdominal pain, history of respiratory disorders.  No smoking.  No drug use.  No diabetes.  Patient is otherwise very healthy.  No current facility-administered medications for this encounter.  Current Outpatient Medications:    cetirizine (ZYRTEC ALLERGY) 10 MG tablet, Take 1 tablet (10 mg total) by mouth at bedtime., Disp: 90 tablet, Rfl: 1   fluticasone (FLONASE) 50 MCG/ACT nasal spray, Place 1 spray into both nostrils daily., Disp: 47.4 mL, Rfl: 1   No Known Allergies  No past medical history on file.   Past Surgical History:  Procedure Laterality Date   right shoulder surgery      Family History  Problem Relation Age of Onset   Healthy Mother    Healthy Father     Social History   Tobacco Use   Smoking status: Never   Smokeless tobacco: Never  Vaping Use   Vaping status: Never Used  Substance Use Topics   Alcohol use: Never   Drug use: Never    ROS   Objective:   Vitals: BP 113/78 (BP Location: Left Arm)   Pulse 79   Temp 98.1 F (36.7 C) (Oral)   Resp 16   SpO2 98%   Physical Exam Constitutional:      General: He is not in acute distress.    Appearance: Normal appearance. He is well-developed. He is not ill-appearing, toxic-appearing or diaphoretic.  HENT:     Head: Normocephalic and atraumatic.     Right Ear: External ear normal.     Left Ear: External ear normal.     Nose: Nose normal.      Mouth/Throat:     Mouth: Mucous membranes are moist.  Eyes:     General: No scleral icterus.       Right eye: No discharge.        Left eye: No discharge.     Extraocular Movements: Extraocular movements intact.  Cardiovascular:     Rate and Rhythm: Normal rate and regular rhythm.     Heart sounds: Normal heart sounds. No murmur heard.    No friction rub. No gallop.  Pulmonary:     Effort: Pulmonary effort is normal. No respiratory distress.     Breath sounds: Normal breath sounds. No stridor. No wheezing, rhonchi or rales.  Chest:     Chest wall: Tenderness (slightly reproducible over mid chest/sternum) present.  Neurological:     Mental Status: He is alert and oriented to person, place, and time.  Psychiatric:        Mood and Affect: Mood normal.        Behavior: Behavior normal.        Thought Content: Thought content normal.     Assessment and Plan :   PDMP not reviewed this encounter.  1. Acute viral syndrome   2. Chest wall pain    Low heart score, low cardiac risk.  Low PERC score.  Suspect musculoskeletal chest pain versus viral syndrome.  Recommended conservative management, supportive care.  Counseled patient on potential for adverse effects with medications prescribed/recommended today, ER and return-to-clinic precautions discussed, patient verbalized understanding.    Wallis Bamberg, PA-C 12/04/23 1233

## 2023-12-04 NOTE — ED Triage Notes (Signed)
Pt c/o central CP and SHOB started yesterday-denies cold/flu sx-NAD-steady gait
